# Patient Record
Sex: Female | Born: 1940 | Race: White | Hispanic: No | Marital: Married | Smoking: Former smoker
Health system: Southern US, Community
[De-identification: ages and names within clinical notes are randomized; demographics above are authoritative.]

## PROBLEM LIST (undated history)

## (undated) DIAGNOSIS — K900 Celiac disease: Secondary | ICD-10-CM

## (undated) DIAGNOSIS — M1990 Unspecified osteoarthritis, unspecified site: Secondary | ICD-10-CM

## (undated) DIAGNOSIS — K5790 Diverticulosis of intestine, part unspecified, without perforation or abscess without bleeding: Secondary | ICD-10-CM

## (undated) DIAGNOSIS — J45909 Unspecified asthma, uncomplicated: Secondary | ICD-10-CM

## (undated) DIAGNOSIS — I499 Cardiac arrhythmia, unspecified: Secondary | ICD-10-CM

## (undated) DIAGNOSIS — M5430 Sciatica, unspecified side: Secondary | ICD-10-CM

## (undated) DIAGNOSIS — C801 Malignant (primary) neoplasm, unspecified: Secondary | ICD-10-CM

## (undated) DIAGNOSIS — F0390 Unspecified dementia without behavioral disturbance: Secondary | ICD-10-CM

## (undated) DIAGNOSIS — Z8489 Family history of other specified conditions: Secondary | ICD-10-CM

## (undated) DIAGNOSIS — J189 Pneumonia, unspecified organism: Secondary | ICD-10-CM

## (undated) HISTORY — PX: TONSILLECTOMY: SUR1361

## (undated) HISTORY — DX: Diverticulosis of intestine, part unspecified, without perforation or abscess without bleeding: K57.90

---

## 2006-12-28 ENCOUNTER — Other Ambulatory Visit: Payer: Self-pay

## 2006-12-28 ENCOUNTER — Emergency Department: Payer: Self-pay | Admitting: Emergency Medicine

## 2015-09-28 ENCOUNTER — Other Ambulatory Visit: Payer: Self-pay | Admitting: Internal Medicine

## 2015-09-28 ENCOUNTER — Ambulatory Visit
Admission: RE | Admit: 2015-09-28 | Discharge: 2015-09-28 | Disposition: A | Discharge status: Home / Self Care | Payer: Medicare Other | Source: Ambulatory Visit | Attending: Internal Medicine | Admitting: Internal Medicine

## 2015-09-28 DIAGNOSIS — R062 Wheezing: Secondary | ICD-10-CM | POA: Diagnosis present

## 2015-09-28 DIAGNOSIS — R0789 Other chest pain: Secondary | ICD-10-CM | POA: Diagnosis not present

## 2015-09-28 DIAGNOSIS — R059 Cough, unspecified: Secondary | ICD-10-CM

## 2015-09-28 DIAGNOSIS — R05 Cough: Secondary | ICD-10-CM | POA: Diagnosis present

## 2015-09-28 DIAGNOSIS — R918 Other nonspecific abnormal finding of lung field: Secondary | ICD-10-CM | POA: Diagnosis not present

## 2016-01-04 ENCOUNTER — Ambulatory Visit: Payer: Medicare Other | Admitting: Family Medicine

## 2016-01-04 ENCOUNTER — Ambulatory Visit (INDEPENDENT_AMBULATORY_CARE_PROVIDER_SITE_OTHER): Payer: Medicare Other | Admitting: Family Medicine

## 2016-01-04 ENCOUNTER — Encounter: Payer: Self-pay | Admitting: Family Medicine

## 2016-01-04 VITALS — BP 134/84 | HR 85 | Ht 66.0 in | Wt 200.0 lb

## 2016-01-04 DIAGNOSIS — J4550 Severe persistent asthma, uncomplicated: Secondary | ICD-10-CM | POA: Diagnosis not present

## 2016-01-04 DIAGNOSIS — J4540 Moderate persistent asthma, uncomplicated: Secondary | ICD-10-CM | POA: Diagnosis not present

## 2016-01-04 DIAGNOSIS — G5701 Lesion of sciatic nerve, right lower limb: Secondary | ICD-10-CM | POA: Diagnosis not present

## 2016-01-04 NOTE — Progress Notes (Signed)
Pre visit review using our clinic review tool, if applicable. No additional management support is needed unless otherwise documented below in the visit note. 

## 2016-01-04 NOTE — Assessment & Plan Note (Signed)
Patient does have some piriformis on the right side. Has tried anti-inflammatories with minimal benefit. I do not believe there is any lumbar radiculopathy but it is within the differential. If continuing pain at follow-up we'll consider x-rays. Patient work with Product/process development scientist to learn home exercises. Encourage patient to start working on a more regular basis. Discussed manual massage.  Piriformis Syndrome  Using an anatomical model, reviewed with the patient the structures involved and how they related to diagnosis. The patient indicated understanding.   The patient was given a handout from Dr. Arne Cleveland book "The Sports Medicine Patient Advisor" describing the anatomy and rehabilitation of the following condition: Piriformis Syndrome  Also given a handout with more extensive Piriformis stretching, hip flexor and abductor strengthening, ham stretching  Rec deep massage, explained self-massage with ball  Patient will return in 3-4 weeks for further evaluation.

## 2016-01-04 NOTE — Progress Notes (Signed)
[facility] [address], [place] [address] Phone: [phone] Subjective:    I'm seeing this patient by the request  of:  TATE,DENNY C, MD  CC: back pain with sciatica on right   [id number] Barbara Savage is a 75 y.o. female coming in with complaint of neck pain with right-sided radiation. Patient states this seems to come on the lateral aspect the leg and stops to her knee. Patient states this seems to be worse with sitting. Patient has tried to be active but not significantly. Patient does do a lot of sitting for work and seems to have had more irritation recently. Patient states when she gets moving he seems to get a little bit better. Patient rates the severity of pain a 6 out of 10. No nighttime awakening. No significant back pain with it. Denies any fever, chills, any abnormal weight loss. No weakness down the leg.     No past medical history on file. chronic persistent asthma No past surgical history on file. patient has had meniscectomies in the knees bilaterally Social History   Social History  . Marital Status: Married    Spouse Name: N/A  . Number of Children: N/A  . Years of Education: N/A   Social History Main Topics  . Smoking status: Not on file  . Smokeless tobacco: Not on file  . Alcohol Use: Not on file  . Drug Use: Not on file  . Sexual Activity: Not on file   Other Topics Concern  . Not on file   Social History Narrative  . No narrative on file   Not on File No family history on file. no pertinent family medical history as related to chief complaint  Past medical history, social, surgical and family history all reviewed in electronic medical record.  No pertanent information unless stated regarding to the chief complaint.   Review of Systems: No headache, visual changes, nausea, vomiting, diarrhea, constipation, dizziness, abdominal pain, skin rash, fevers, chills, night sweats, weight loss, swollen lymph  nodes, body aches, joint swelling, muscle aches, chest pain, shortness of breath, mood changes.   Objective Blood pressure 134/84, pulse 85, height 5\' 6"  (1.676 m), weight 200 lb (90.719 kg), SpO2 97 %.  General: No apparent distress alert and oriented x3 mood and affect normal, dressed appropriately.  HEENT: Pupils equal, extraocular movements intact  Respiratory: Patient's speak in full sentences and does not appear short of breath mild audible wheeze Cardiovascular: No lower extremity edema, non tender, no erythema  Skin: Warm dry intact with no signs of infection or rash on extremities or on axial skeleton.  Abdomen: Soft nontender  Neuro: Cranial nerves II through XII are intact, neurovascularly intact in all extremities with 2+ DTRs and 2+ pulses.  Lymph: No lymphadenopathy of posterior or anterior cervical chain or axillae bilaterally.  Gait normal with good balance and coordination.  MSK:  Non tender with full range of motion and good stability and symmetric strength and tone of shoulders, elbows, wrist, hip, knee and ankles bilaterally.  Back Exam:  Inspection: Unremarkable  Motion: Flexion 45 deg, Extension 25 deg, Side Bending to 25 deg bilaterally,  Rotation to 35 deg bilaterally  SLR laying: Negative  XSLR laying: Negative  Palpable tenderness: Severely tender to palpation over the right piriformis FABER: Positive right side Sensory change: Gross sensation intact to all lumbar and sacral dermatomes.  Reflexes: 2+ at both patellar tendons, 2+ at achilles tendons, Babinski's downgoing.  Strength  at foot  Plantar-flexion: 5/5 Dorsi-flexion: 5/5 Eversion: 5/5 Inversion: 5/5  Leg strength  Quad: 5/5 Hamstring: 5/5 Hip flexor: 5/5 Hip abductors: 4/5 but symmetric Gait unremarkable.  Procedure note [id number]; 15 minutes spent for Therapeutic exercises as stated in above notes.  This included exercises focusing on stretching, strengthening, with significant focus on eccentric aspects.  Piriformis Syndrome  Using an anatomical model, reviewed with the patient the structures involved and how they related to diagnosis. The patient indicated understanding.   The patient was given a handout from Dr. Arne Cleveland book "The Sports Medicine Patient Advisor" describing the anatomy and rehabilitation of the following condition: Piriformis Syndrome  Also given a handout with more extensive Piriformis stretching, hip flexor and abductor strengthening, ham stretching  Rec deep massage, explained self-massage with ball    Proper technique shown and discussed handout in great detail with ATC.  All questions were discussed and answered.      Impression and Recommendations:     This case required medical decision making of moderate complexity.      Note: This dictation was prepared with Dragon dictation along with smaller phrase technology. Any transcriptional errors that result from this process are unintentional.

## 2016-01-04 NOTE — Patient Instructions (Signed)
Good to see you.  Ice 20 minutes 2 times daily. Usually after activity and before bed. Exercises 3 times a week.  Tennis ball in back right pocket with sitting Double up your vitamin D to 1600 IU daily I love the idea of the pool.  Good shoes with rigid bottom.  Jalene Mullet, [place] or New balance greater then 700 Stay active See me again in 3-4 weeks to make sure you are all the way better Happy New Year!

## 2016-01-10 ENCOUNTER — Institutional Professional Consult (permissible substitution): Payer: Medicare Other | Admitting: Internal Medicine

## 2016-01-13 ENCOUNTER — Encounter: Payer: Self-pay | Admitting: Internal Medicine

## 2016-01-13 ENCOUNTER — Ambulatory Visit (INDEPENDENT_AMBULATORY_CARE_PROVIDER_SITE_OTHER): Payer: Medicare Other | Admitting: Internal Medicine

## 2016-01-13 VITALS — BP 164/102 | HR 90 | Ht 66.0 in | Wt 199.0 lb

## 2016-01-13 DIAGNOSIS — R0600 Dyspnea, unspecified: Secondary | ICD-10-CM | POA: Insufficient documentation

## 2016-01-13 DIAGNOSIS — I10 Essential (primary) hypertension: Secondary | ICD-10-CM

## 2016-01-13 NOTE — Progress Notes (Signed)
Quick Note:  Pt aware ______ 

## 2016-01-13 NOTE — Patient Instructions (Signed)
Stop Breo and call me with the name of the other inhaler (don't start it until/unless breathing or coughing get worse but we definitely need to know what it is)  If hoarseness not better off BREO:  Try prilosec otc 20mg   Take 30-60 min before first meal of the day and Pepcid ac (famotidine) 20 mg one @  bedtime x 4 weeks trial and if not better you need to see ENT    GERD (REFLUX)  is an extremely common cause of respiratory symptoms just like yours , many times with no obvious heartburn at all.    It can be treated with medication, but also with lifestyle changes including elevation of the head of your bed (ideally with 6 inch  bed blocks),  Smoking cessation, avoidance of late meals, excessive alcohol, and avoid fatty foods, chocolate, peppermint, colas, red wine, and acidic juices such as orange juice.  NO MINT OR MENTHOL PRODUCTS SO NO COUGH DROPS  USE SUGARLESS CANDY INSTEAD (Jolley ranchers or Stover's or Life Savers) or even ice chips will also do - the key is to swallow to prevent all throat clearing. NO OIL BASED VITAMINS - use powdered substitutes.

## 2016-01-13 NOTE — Progress Notes (Signed)
Subjective:    Patient ID: Barbara Savage, female    DOB: 10/20/1941, 75 y.o.   MRN: [id number]  HPI  51 yowm quit smoking age 59 with h/o breathing problems as 77 y old saw allergist > pos allergies/ rec shot, better p moved new house so no shots then mono as teenager some breathing difficulty with this but better p tonsilectomy then started getting "bronchitis" age 78 [place] > eval allergies to cat/dog > rx inhalers > got rid of the cat and later inhalers and also rec NF > never had it as rec  and weaned off all meds except for rare albuterol then Oct 2016 lots of cough / nasal congestion and epistaxis  > eval by Hall Busing >  Some better on first inhaler but doesn't know the name, wasn't for asthma ? [facility]ama changed then to  [facility] > no better vs the first inhaler > then worse hoarseness so referred 01/13/2016 to pulmonary clinic by Dr Charlann Boxer to sort out what she needs    01/13/2016 1st [facility] office visit/ Barbara Savage   Chief Complaint  Patient presents with  . Pulmonary Consult    Referred by Dr. Gardenia Phlegm. Pt states that she was dxed with Asthma approx 10 yrs ago. She states c/o SOB, cough and hoarseness since Oct 2016. She also c/o right side CP upon inspiration.  She states that her cough is worse when exposed to smoke and when she talks too much. Cough is prod with minimal light yellow sputum.    cp comes and goes since  aug 2016 RUQ not in the chest and  not really pleuritic, better lying down  Hoarseness is worse  The more she speaks/ cough is minimal now/ assoc with sore throat  No obvious day to day or daytime variability or assoc   chest tightness, subjective wheeze or overt sinus or hb symptoms. No unusual exp hx or h/o childhood pna/ asthma or knowledge of premature birth.  Sleeping ok without nocturnal  or early am exacerbation  of respiratory  c/o's or need for noct saba. Also denies any obvious fluctuation of symptoms with weather or environmental changes or other  aggravating or alleviating factors except as outlined above   Current Medications, Allergies, Complete Past Medical History, Past Surgical History, Family History, and Social History were reviewed in [employer] record.              Review of Systems  Constitutional: Negative for fever, chills and unexpected weight change.  HENT: Positive for sore throat and trouble swallowing. Negative for congestion, dental problem, ear pain, nosebleeds, postnasal drip, rhinorrhea, sinus pressure, sneezing and voice change.   Eyes: Negative for visual disturbance.  Respiratory: Positive for cough and shortness of breath. Negative for choking.   Cardiovascular: Positive for chest pain. Negative for leg swelling.  Gastrointestinal: Negative for vomiting, abdominal pain and diarrhea.  Genitourinary: Negative for difficulty urinating.  Musculoskeletal: Negative for arthralgias.  Skin: Negative for rash.  Neurological: Positive for headaches. Negative for tremors and syncope.  Hematological: Does not bruise/bleed easily.       Objective:   Physical Exam  amb wf  nad - classic voice fatigue   Wt Readings from Last 3 Encounters:  01/13/16 199 lb (90.266 kg)  01/04/16 200 lb (90.719 kg)    Vital signs reviewed    HEENT: nl dentition, turbinates, and oropharynx. Nl external ear canals without cough reflex   NECK :  without  JVD/Nodes/TM/ nl carotid upstrokes bilaterally   LUNGS: no acc muscle use,  Nl contour chest which is clear to A and P bilaterally without cough on insp or exp maneuvers   CV:  RRR  no s3 or murmur or increase in P2, no edema   ABD:  soft and nontender with nl inspiratory excursion in the supine position. No bruits or organomegaly, bowel sounds nl  MS:  Nl gait/ ext warm without deformities, calf tenderness, cyanosis or clubbing No obvious joint restrictions   SKIN: warm and dry without lesions    NEURO:  alert, approp, nl sensorium with  no  motor deficits     I personally reviewed images and agree with radiology impression as follows:  CXR:  09/28/15 1. Mild lingular subsegmental atelectasis and or infiltrate. Pleural parenchymal scarring could also present in this fashion.  2. Borderline cardiomegaly. No pulmonary venous congestion      Assessment & Plan:

## 2016-01-14 ENCOUNTER — Telehealth: Payer: Self-pay | Admitting: *Deleted

## 2016-01-14 ENCOUNTER — Encounter: Payer: Self-pay | Admitting: Internal Medicine

## 2016-01-14 DIAGNOSIS — I10 Essential (primary) hypertension: Secondary | ICD-10-CM | POA: Insufficient documentation

## 2016-01-14 NOTE — Telephone Encounter (Signed)
Spoke with the pt  She will followup with PCP on BP  She is aware Arlyce Harman is normal  The name of the other inhaler is Anoro  She will let us know if her symptoms do not improve off of Breo

## 2016-01-14 NOTE — Assessment & Plan Note (Signed)
-   Spirometry 01/13/2016  FEV1 2.26 (98%)  Ratio 71 on Breo  Symptoms are markedly disproportionate to objective findings and not clear this is a lung problem but pt does appear to have difficult airway management issues. DDX of  difficult airways management almost all start with A and  include Adherence, Ace Inhibitors, Acid Reflux, Active Sinus Disease, Alpha 1 Antitripsin deficiency, Anxiety masquerading as Airways dz,  ABPA,  Allergy(esp in young), Aspiration (esp in elderly), Adverse effects of meds,  Active smokers, A bunch of PE's (a small clot burden can't cause this syndrome unless there is already severe underlying pulm or vascular dz with poor reserve) plus two Bs  = Bronchiectasis and Beta blocker use..and one C= CHF   Adherence is always the initial "prime suspect" and is a multilayered concern that requires a "trust but verify" approach in every patient - starting with knowing how to use medications, especially inhalers, correctly, keeping up with refills and understanding the fundamental difference between maintenance and prns vs those medications only taken for a very short course and then stopped and not refilled.   ? Allergy/ asthma > if she has it at all it seems more intermittent than chronic and this type of pt best served by either symbicort or dulera low doses 2 q 12 h prn.  Since she has "an inhaler that worked before the breo was started have asked her to let me know what that is before I rec another.  ? Adverse effect of dpi > classic voice fatigue >> try off breo   ? Acid (or non-acid) GERD > always difficult to exclude as up to 75% of pts in some series report no assoc GI/ Heartburn symptoms> rec max (24h)  acid suppression and diet restrictions/ reviewed and instructions given in writing.

## 2016-01-14 NOTE — Telephone Encounter (Signed)
-----   Message from Tanda Rockers, MD sent at 01/14/2016  6:37 AM EST ----- Call to find out what the name was of the other inhaler/ i didn't see the bp was up> f/u pc Let her know spiriometry wnl

## 2016-01-14 NOTE — Assessment & Plan Note (Signed)
Noted/ rec avoid na/ Follow up per Primary Care

## 2016-01-25 ENCOUNTER — Encounter: Payer: Self-pay | Admitting: Family Medicine

## 2016-01-25 ENCOUNTER — Ambulatory Visit (INDEPENDENT_AMBULATORY_CARE_PROVIDER_SITE_OTHER): Payer: Medicare Other | Admitting: Family Medicine

## 2016-01-25 VITALS — BP 132/80 | Wt 203.0 lb

## 2016-01-25 DIAGNOSIS — G5701 Lesion of sciatic nerve, right lower limb: Secondary | ICD-10-CM

## 2016-01-25 MED ORDER — MELOXICAM 7.5 MG PO TABS: 7.5000 mg | ORAL_TABLET | Freq: Every day | ORAL | Status: DC

## 2016-01-25 NOTE — Patient Instructions (Signed)
Good to see you  Meloxicam 7.5 mg daily for 2 weeks then 3 times a week for 2 weeks then discontinue I love the swimming but should incorporate 1-2 days a week of full body resistance training on the machines Ice 20 minutes 2 times daily. Usually after activity and before bed. Continue with the ball. It will do great  I am impressed and want to see you again in 6 weeks.

## 2016-01-25 NOTE — Assessment & Plan Note (Signed)
Patient is making strides. We'll decrease patient's meloxicam dose to see if this would be beneficial. She will monitor her asthma. We discussed icing regimen. Discussed continuing the manual massage. Declined formal physical therapy. Follow-up in 6 weeks.

## 2016-01-25 NOTE — Progress Notes (Signed)
[facility] [address], [place] [address] Phone: [phone] Subjective:    CC: back pain with sciatica on right follow-up   [id number] Barbara Savage is a 75 y.o. female coming in with complaint of back pain on the right side. Patient was found to have more of a piriformis syndrome. Patient has been doing manual massage, home exercises, as well as taking meloxicam. States that the anti-inflammatory was causing some mild exacerbation of her asthma. Stopped this but feels that it was helping. Patient states overall she is about 50-60% better. Has started swimming again which has helped as well. Denies that the radicular symptoms are worsening. States that she seems to be more comfortable at night.     No past medical history on file. chronic persistent asthma No past surgical history on file. patient has had meniscectomies in the knees bilaterally Social History   Social History  . Marital Status: Married    Spouse Name: N/A  . Number of Children: N/A  . 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 Smoking status: Former Smoker -- 2.50 packs/day for 10 years    Types: Cigarettes    Quit date: 12/25/1978  . Smokeless tobacco: Never Used  . Alcohol Use: No  . Drug Use: No  . Sexual Activity: Not Asked   Other Topics Concern  . None   Social History Narrative   Allergies  Allergen Reactions  . Penicillins Rash   No family history on file. no pertinent family medical history as related to chief complaint  Past medical history, social, surgical and family history all reviewed in electronic medical record.  No pertanent information unless stated regarding to the chief complaint.   Review of Systems: No headache, visual changes, nausea, vomiting, diarrhea, constipation, dizziness, abdominal pain, skin rash, fevers, chills, night sweats, weight loss, swollen lymph nodes, body aches, joint  swelling, muscle aches, chest pain, shortness of breath, mood changes.   Objective Blood pressure 132/80, weight 203 lb (92.08 kg).  General: No apparent distress alert and oriented x3 mood and affect normal, dressed appropriately.  HEENT: Pupils equal, extraocular movements intact  Respiratory: Patient's speak in full sentences and does not appear short of breath mild audible wheeze Cardiovascular: No lower extremity edema, non tender, no erythema  Skin: Warm dry intact with no signs of infection or rash on extremities or on axial skeleton.  Abdomen: Soft nontender  Neuro: Cranial nerves II through XII are intact, neurovascularly intact in all extremities with 2+ DTRs and 2+ pulses.  Lymph: No lymphadenopathy of posterior or anterior cervical chain or axillae bilaterally.  Gait normal with good balance and coordination.  MSK:  Non tender with full range of motion and good stability and symmetric strength and tone of shoulders, elbows, wrist, hip, knee and ankles bilaterally.  Back Exam:  Inspection: Unremarkable  Motion: Flexion 45 deg, Extension 25 deg, Side Bending to 25 deg bilaterally,  Rotation to 35 deg bilaterally  SLR laying: Negative  XSLR laying: Negative  Palpable tenderness: Mild tenderness still over the right piriformis FABER: Positive right side Sensory change: Gross sensation intact to all lumbar and sacral dermatomes.  Reflexes: 2+ at both patellar tendons, 2+ at achilles tendons, Babinski's downgoing.  Strength at foot  Plantar-flexion: 5/5 Dorsi-flexion: 5/5 Eversion: 5/5 Inversion: 5/5  Leg strength  Quad: 5/5 Hamstring: 5/5 Hip flexor: 5/5 Hip abductors: 4/5 but symmetric  Gait unremarkable.   Impression and Recommendations:     This case required medical decision making of moderate complexity.      Note: This dictation was prepared with Dragon dictation along with smaller phrase technology. Any transcriptional errors that result from this process are  unintentional.

## 2016-02-15 ENCOUNTER — Ambulatory Visit: Payer: Medicare Other | Admitting: Family Medicine

## 2016-03-07 ENCOUNTER — Ambulatory Visit: Payer: Medicare Other | Admitting: Family Medicine

## 2016-03-14 ENCOUNTER — Ambulatory Visit (INDEPENDENT_AMBULATORY_CARE_PROVIDER_SITE_OTHER): Payer: Medicare Other | Admitting: Family Medicine

## 2016-03-14 VITALS — BP 138/82 | HR 76 | Wt 200.0 lb

## 2016-03-14 DIAGNOSIS — G5701 Lesion of sciatic nerve, right lower limb: Secondary | ICD-10-CM

## 2016-03-14 NOTE — Patient Instructions (Signed)
Good to see you  Lets try  Turmeric 500mg  twice daily  Tart cherry extract any dose at night I love the swimming and then aerobics with light weights See me when you need me.

## 2016-03-14 NOTE — Assessment & Plan Note (Signed)
Overall doing well. If any exacerbation for patient come back. Patient is taking ibuprofen occasionally. We discussed over-the-counter medications. Otherwise patient will follow-up as needed.

## 2016-03-14 NOTE — Progress Notes (Signed)
[facility] [address] [place], [place] [address] Phone: [phone] Subjective:    CC: back pain with sciatica on right follow-up   [id number] Barbara Savage is a 75 y.o. female coming in with complaint of back pain on the right side. Patient was found to have more of a piriformis syndrome. Patient has been doing manual massage, home exercises, as well as taking meloxicam what caused exacerbation of her asthma. Patient was continuing to increase her activity and was doing 60-70% better previously. Encourage her to continue with the conservative therapy. Patient statesshe is doing about 85% better. Not doing any other conservative therapy at this time because she this is a stressful time for her job. States that overall she is feeling much better.    No past medical history on file. chronic persistent asthma No past surgical history on file. patient has had meniscectomies in the knees bilaterally Social History   Social History  . Marital Status: Married    Spouse Name: N/A  . Number of Children: N/A  . 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 Smoking status: Former Smoker -- 2.50 packs/day for 10 years    Types: Cigarettes    Quit date: 12/25/1978  . Smokeless tobacco: Never Used  . Alcohol Use: No  . Drug Use: No  . Sexual Activity: Not on file   Other Topics Concern  . Not on file   Social History Narrative   Allergies  Allergen Reactions  . Penicillins Rash   No family history on file. no pertinent family medical history as related to chief complaint  Past medical history, social, surgical and family history all reviewed in electronic medical record.  No pertanent information unless stated regarding to the chief complaint.   Review of Systems: No headache, visual changes, nausea, vomiting, diarrhea, constipation, dizziness, abdominal pain, skin rash, fevers, chills, night sweats,  weight loss, swollen lymph nodes, body aches, joint swelling, muscle aches, chest pain, shortness of breath, mood changes.   Objective Blood pressure 138/82, pulse 76, weight 200 lb (90.719 kg).  General: No apparent distress alert and oriented x3 mood and affect normal, dressed appropriately.  HEENT: Pupils equal, extraocular movements intact  Respiratory: Patient's speak in full sentences and does not appear short of breath mild audible wheeze Cardiovascular: No lower extremity edema, non tender, no erythema  Skin: Warm dry intact with no signs of infection or rash on extremities or on axial skeleton.  Abdomen: Soft nontender  Neuro: Cranial nerves II through XII are intact, neurovascularly intact in all extremities with 2+ DTRs and 2+ pulses.  Lymph: No lymphadenopathy of posterior or anterior cervical chain or axillae bilaterally.  Gait normal with good balance and coordination.  MSK:  Non tender with full range of motion and good stability and symmetric strength and tone of shoulders, elbows, wrist, hip, knee and ankles bilaterally.  Back Exam:  Inspection: Unremarkable  Motion: Flexion 45 deg, Extension 25 deg, Side Bending to 25 deg bilaterally,  Rotation to 35 deg bilaterally  SLR laying: Negative  XSLR laying: Negative  Palpable tenderness: very minimal tenderness of the right piriformis FABER: Positive right side Sensory change: Gross sensation intact to all lumbar and sacral dermatomes.  Reflexes: 2+ at both patellar tendons, 2+ at achilles tendons, Babinski's downgoing.  Strength at foot  Plantar-flexion: 5/5 Dorsi-flexion: 5/5 Eversion: 5/5 Inversion: 5/5  Leg strength  Quad:  5/5 Hamstring: 5/5 Hip flexor: 5/5 Hip abductors: 4/5 but symmetric Gait unremarkable. Mild improvement   Impression and Recommendations:     This case required medical decision making of moderate complexity.      Note: This dictation was prepared with Dragon dictation along with smaller  phrase technology. Any transcriptional errors that result from this process are unintentional.

## 2016-04-17 ENCOUNTER — Other Ambulatory Visit: Payer: Self-pay | Admitting: Family Medicine

## 2016-06-08 ENCOUNTER — Encounter (HOSPITAL_COMMUNITY): Payer: Self-pay

## 2016-06-08 ENCOUNTER — Emergency Department (HOSPITAL_COMMUNITY): Payer: Medicare Other

## 2016-06-08 ENCOUNTER — Observation Stay (HOSPITAL_COMMUNITY)
Admission: EM | Admit: 2016-06-08 | Discharge: 2016-06-10 | Disposition: A | Discharge status: Home / Self Care | Payer: Medicare Other | Attending: Family Medicine | Admitting: Family Medicine

## 2016-06-08 DIAGNOSIS — Z79899 Other long term (current) drug therapy: Secondary | ICD-10-CM | POA: Diagnosis not present

## 2016-06-08 DIAGNOSIS — J45909 Unspecified asthma, uncomplicated: Secondary | ICD-10-CM | POA: Insufficient documentation

## 2016-06-08 DIAGNOSIS — Z87891 Personal history of nicotine dependence: Secondary | ICD-10-CM | POA: Diagnosis not present

## 2016-06-08 DIAGNOSIS — J4540 Moderate persistent asthma, uncomplicated: Secondary | ICD-10-CM | POA: Diagnosis present

## 2016-06-08 DIAGNOSIS — K5792 Diverticulitis of intestine, part unspecified, without perforation or abscess without bleeding: Secondary | ICD-10-CM | POA: Diagnosis not present

## 2016-06-08 DIAGNOSIS — I10 Essential (primary) hypertension: Secondary | ICD-10-CM | POA: Diagnosis present

## 2016-06-08 DIAGNOSIS — R1084 Generalized abdominal pain: Secondary | ICD-10-CM | POA: Diagnosis present

## 2016-06-08 HISTORY — DX: Sciatica, unspecified side: M54.30

## 2016-06-08 HISTORY — DX: Celiac disease: K90.0

## 2016-06-08 HISTORY — DX: Unspecified asthma, uncomplicated: J45.909

## 2016-06-08 LAB — COMPREHENSIVE METABOLIC PANEL
ALT: 20 U/L (ref 14–54)
ANION GAP: 8 (ref 5–15)
AST: 19 U/L (ref 15–41)
Albumin: 4.1 g/dL (ref 3.5–5.0)
Alkaline Phosphatase: 78 U/L (ref 38–126)
BUN: 10 mg/dL (ref 6–20)
CHLORIDE: 102 mmol/L (ref 101–111)
CO2: 23 mmol/L (ref 22–32)
CREATININE: 0.65 mg/dL (ref 0.44–1.00)
Calcium: 8.9 mg/dL (ref 8.9–10.3)
GFR calc non Af Amer: >60 mL/min (ref >60)
Glucose, Bld: 109 mg/dL — ABNORMAL HIGH (ref 65–99)
Potassium: 3.5 mmol/L (ref 3.5–5.1)
SODIUM: 133 mmol/L — AB (ref 135–145)
Total Bilirubin: 1.1 mg/dL (ref 0.3–1.2)
Total Protein: 7.6 g/dL (ref 6.5–8.1)

## 2016-06-08 LAB — CBC
HEMATOCRIT: 40.4 % (ref 36.0–46.0)
HEMOGLOBIN: 13.4 g/dL (ref 12.0–15.0)
MCH: 30.5 pg (ref 26.0–34.0)
MCHC: 33.2 g/dL (ref 30.0–36.0)
MCV: 91.8 fL (ref 78.0–100.0)
Platelets: 287 10*3/uL (ref 150–400)
RBC: 4.4 MIL/uL (ref 3.87–5.11)
RDW: 13.7 % (ref 11.5–15.5)
WBC: 17.7 10*3/uL — ABNORMAL HIGH (ref 4.0–10.5)

## 2016-06-08 LAB — URINALYSIS, ROUTINE W REFLEX MICROSCOPIC
Bilirubin Urine: NEGATIVE
GLUCOSE, UA: NEGATIVE mg/dL
Ketones, ur: NEGATIVE mg/dL
Nitrite: NEGATIVE
Protein, ur: NEGATIVE mg/dL
SPECIFIC GRAVITY, URINE: 1.025 (ref 1.005–1.030)
pH: 5.5 (ref 5.0–8.0)

## 2016-06-08 LAB — URINE MICROSCOPIC-ADD ON

## 2016-06-08 LAB — LIPASE, BLOOD: LIPASE: 20 U/L (ref 11–51)

## 2016-06-08 LAB — LACTIC ACID, PLASMA: LACTIC ACID, VENOUS: 0.8 mmol/L (ref 0.5–2.0)

## 2016-06-08 MED ORDER — IOPAMIDOL (ISOVUE-300) INJECTION 61%
100.0000 mL | Freq: Once | INTRAVENOUS | Status: AC | PRN
  Administered 2016-06-08: 100 mL via INTRAVENOUS

## 2016-06-08 MED ORDER — SODIUM CHLORIDE 0.9 % IV SOLN
INTRAVENOUS | Status: DC
  Administered 2016-06-08: 23:00:00 via INTRAVENOUS

## 2016-06-08 NOTE — ED Provider Notes (Signed)
CSN: [id number]     Arrival date & time 06/08/16  1708 History   First MD Initiated Contact with Patient 06/08/16 2114     No chief complaint on file.     HPI  Pt was seen at 2120.  Per pt, c/o gradual onset and persistence of constant generalized abd "pain" for the past 3 days.  Has been associated with multiple intermittent episodes of "loose stools" and urinary urgency/frequency. Pt was evaluated by her PMD today, was told "I have rebound," and was sent to the ED for further evaluation.  Denies N/V, no diarrhea, no fevers, no back pain, no rash, no CP/SOB, no black or blood in stools, no dysuria/hematuria.     Past Medical History  Diagnosis Date  . Sciatica   . Asthma   . Celiac disease    Past Surgical History  Procedure Laterality Date  . Tonsillectomy      Social History  Substance Use Topics  . Smoking status: Former Smoker -- 2.50 packs/day for 10 years    Types: Cigarettes    Quit date: 12/25/1978  . Smokeless tobacco: Never Used  . Alcohol Use: No    Review of Systems ROS: Statement: All systems negative except as marked or noted in the HPI; Constitutional: Negative for fever and chills. ; ; Eyes: Negative for eye pain, redness and discharge. ; ; ENMT: Negative for ear pain, hoarseness, nasal congestion, sinus pressure and sore throat. ; ; Cardiovascular: Negative for chest pain, palpitations, diaphoresis, dyspnea and peripheral edema. ; ; Respiratory: Negative for cough, wheezing and stridor. ; ; Gastrointestinal: +abd pain, loose stool. Negative for nausea, vomiting, diarrhea, blood in stool, hematemesis, jaundice and rectal bleeding. . ; ; Genitourinary: Negative for dysuria, flank pain and hematuria. ; ; Musculoskeletal: Negative for back pain and neck pain. Negative for swelling and trauma.; ; Skin: Negative for pruritus, rash, abrasions, blisters, bruising and skin lesion.; ; Neuro: Negative for headache, lightheadedness and neck stiffness. Negative for weakness,  altered level of consciousness, altered mental status, extremity weakness, paresthesias, involuntary movement, seizure and syncope.      Allergies  Breo ellipta; Mobic; and Penicillins  Home Medications   Prior to Admission medications   Medication Sig Start Date End Date Taking? Authorizing Provider  B Complex Vitamins (B COMPLEX 50 PO) Take 1 tablet by mouth daily. Reported on 03/14/2016   Yes Historical Provider, MD  Ergocalciferol (VITAMIN D2) 2000 units TABS Take 1 tablet by mouth daily.   Yes Historical Provider, MD  Lutein 20 MG CAPS Take 1 capsule by mouth daily. Reported on 03/14/2016   Yes Historical Provider, MD  Magnesium 200 MG TABS Take 1 tablet by mouth daily. Reported on 03/14/2016   Yes Historical Provider, MD  Multiple Vitamin (MULTIVITAMIN WITH MINERALS) TABS tablet Take 1 tablet by mouth daily.   Yes Historical Provider, MD  PROAIR HFA [id number] Base) MCG/ACT inhaler Inhale 2 puffs into the lungs every 6 (six) hours as needed for wheezing or shortness of breath.  12/07/15  Yes Historical Provider, MD  Pseudoephedrine-Acetaminophen (SINUS RELIEF PO) Take 1 tablet by mouth daily as needed (for sinus relief).   Yes Historical Provider, MD  Quercetin 250 MG TABS Take 1 tablet by mouth daily. For allergy health   Yes Historical Provider, MD   BP 146/80 mmHg  Pulse 104  Temp(Src) 99.2 F (37.3 C) (Oral)  Resp 20  Ht 5\' 6"  (1.676 m)  Wt 200 lb (90.719 kg)  BMI 32.30 kg/m2  SpO2 95%   BP 134/64 mmHg  Pulse 83  Temp(Src) 98 F (36.7 C) (Oral)  Resp 20  Ht 5\' 6"  (1.676 m)  Wt 200 lb 2.8 oz (90.8 kg)  BMI 32.32 kg/m2  SpO2 97%  Physical Exam  2125: Physical examination:  Nursing notes reviewed; Vital signs and O2 SAT reviewed;  Constitutional: Well developed, Well nourished, Well hydrated, In no acute distress; Head:  Normocephalic, atraumatic; Eyes: EOMI, PERRL, No scleral icterus; ENMT: Mouth and pharynx normal, Mucous membranes moist; Neck: Supple, Full range of  motion, No lymphadenopathy; Cardiovascular: Regular rate and rhythm, No gallop; Respiratory: Breath sounds clear & equal bilaterally, No wheezes.  Speaking full sentences with ease, Normal respiratory effort/excursion; Chest: Nontender, Movement normal; Abdomen: Soft, +diffuse tenderness to palp. No rebound or guarding. Nondistended, Normal bowel sounds; Genitourinary: No CVA tenderness; Extremities: Pulses normal, No tenderness, No edema, No calf edema or asymmetry.; Neuro: AA&Ox3, Major CN grossly intact.  Speech clear. No gross focal motor or sensory deficits in extremities. Climbs on and off stretcher easily by herself. Gait steady.; Skin: Color normal, Warm, Dry.   ED Course  Procedures (including critical care time) Labs Review  Imaging Review  I have personally reviewed and evaluated these images and lab results as part of my medical decision-making.   EKG Interpretation None      MDM  MDM Reviewed: previous chart, nursing note and vitals Reviewed previous: labs Interpretation: labs, x-ray and CT scan     Results for orders placed or performed during the hospital encounter of 06/08/16  Lipase, blood  Result Value Ref Range   Lipase 20 11 - 51 U/L  Comprehensive metabolic panel  Result Value Ref Range   Sodium 133 (L) 135 - 145 mmol/L   Potassium 3.5 3.5 - 5.1 mmol/L   Chloride 102 101 - 111 mmol/L   CO2 23 22 - 32 mmol/L   Glucose, Bld 109 (H) 65 - 99 mg/dL   BUN 10 6 - 20 mg/dL   Creatinine, Ser 0.65 0.44 - 1.00 mg/dL   Calcium 8.9 8.9 - 10.3 mg/dL   Total Protein 7.6 6.5 - 8.1 g/dL   Albumin 4.1 3.5 - 5.0 g/dL   AST 19 15 - 41 U/L   ALT 20 14 - 54 U/L   Alkaline Phosphatase 78 38 - 126 U/L   Total Bilirubin 1.1 0.3 - 1.2 mg/dL   GFR calc non Af Amer >60 >60 mL/min   GFR calc Af Amer >60 >60 mL/min   Anion gap 8 5 - 15  CBC  Result Value Ref Range   WBC 17.7 (H) 4.0 - 10.5 K/uL   RBC 4.40 3.87 - 5.11 MIL/uL   Hemoglobin 13.4 12.0 - 15.0 g/dL   HCT 40.4 36.0  - 46.0 %   MCV 91.8 78.0 - 100.0 fL   MCH 30.5 26.0 - 34.0 pg   MCHC 33.2 30.0 - 36.0 g/dL   RDW 13.7 11.5 - 15.5 %   Platelets 287 150 - 400 K/uL  Urinalysis, Routine w reflex microscopic  Result Value Ref Range   Color, Urine YELLOW YELLOW   APPearance CLEAR CLEAR   Specific Gravity, Urine 1.025 1.005 - 1.030   pH 5.5 5.0 - 8.0   Glucose, UA NEGATIVE NEGATIVE mg/dL   Hgb urine dipstick SMALL (A) NEGATIVE   Bilirubin Urine NEGATIVE NEGATIVE   Ketones, ur NEGATIVE NEGATIVE mg/dL   Protein, ur NEGATIVE NEGATIVE mg/dL   Nitrite NEGATIVE NEGATIVE   Leukocytes,  UA SMALL (A) NEGATIVE  Urine microscopic-add on  Result Value Ref Range   Squamous Epithelial / LPF 6-30 (A) NONE SEEN   WBC, UA 6-30 0 - 5 WBC/hpf   RBC / HPF 0-5 0 - 5 RBC/hpf   Bacteria, UA MANY (A) NONE SEEN  Lactic acid, plasma  Result Value Ref Range   Lactic Acid, Venous 0.8 0.5 - 2.0 mmol/L   Dg Chest 2 View 06/08/2016  CLINICAL DATA:  Acute onset of lower abdominal pain and diarrhea. Initial encounter. EXAM: CHEST  2 VIEW COMPARISON:  None. FINDINGS: The lungs are well-aerated and clear. There is no evidence of focal opacification, pleural effusion or pneumothorax. The heart is normal in size; the mediastinal contour is within normal limits. No acute osseous abnormalities are seen. IMPRESSION: No acute cardiopulmonary process seen. Electronically Signed   By: Garald Balding M.D.   On: 06/08/2016 23:36   Ct Abdomen Pelvis W Contrast 06/09/2016  CLINICAL DATA:  Acute onset of generalized abdominal pain and rebound tenderness. Increased urinary urgency. Initial encounter. EXAM: CT ABDOMEN AND PELVIS WITH CONTRAST TECHNIQUE: Multidetector CT imaging of the abdomen and pelvis was performed using the standard protocol following bolus administration of intravenous contrast. CONTRAST:  127mL ISOVUE-300 IOPAMIDOL (ISOVUE-300) INJECTION 61% COMPARISON:  None. FINDINGS: The visualized lung bases are clear. A tiny hiatal hernia is  noted. Scattered coronary artery calcifications are seen. Mild calcification is noted at the aortic valve. The liver is unremarkable in appearance. The spleen is mildly enlarged, measuring 13.9 cm in length. The gallbladder is within normal limits. The pancreas and left adrenal gland are unremarkable. A 2.8 cm right adrenal adenoma is noted. The kidneys are unremarkable in appearance. There is no evidence of hydronephrosis. No renal or ureteral stones are seen. No perinephric stranding is appreciated. The small bowel is unremarkable in appearance. The stomach is within normal limits. No acute vascular abnormalities are seen. The appendix is normal in caliber, without evidence of appendicitis. Contrast progresses to the level of the splenic flexure of the colon. A relatively long segment of soft tissue inflammation is noted along the proximal and mid sigmoid colon, with associated wall thickening. Underlying diverticulosis is noted along the descending and sigmoid colon. Findings are concerning for a relatively long segment of acute diverticulitis. Trace associated free fluid is seen. No definite perforation or abscess formation is seen at this time. The bladder is moderately distended and grossly unremarkable. Minimal calcifications are seen within the uterus. The ovaries are relatively symmetric. No suspicious adnexal masses are seen. No inguinal lymphadenopathy is seen. No acute osseous abnormalities are identified. There is mild grade 1 retrolisthesis of L2 on L3 and of L3 on L4, with multilevel vacuum phenomenon. Underlying facet disease is noted. IMPRESSION: 1. Relatively long segment of soft tissue inflammation along the proximal and mid sigmoid colon, with associated wall thickening. Underlying diverticulosis along the descending and sigmoid colon. Findings concerning for a relatively long segment of acute diverticulitis. Trace associated free fluid seen. No evidence of perforation or abscess formation. 2.  Tiny hiatal hernia noted. 3. Scattered coronary artery calcifications seen. Mild calcification at the aortic valve. 4. Mild splenomegaly. 5. 2.8 cm right adrenal adenoma noted. 6. Mild degenerative change along the lumbar spine. Electronically Signed   By: Garald Balding M.D.   On: 06/09/2016 00:02    0010:  Udip appears contaminated, though given pt's symptoms, will obtain UC. CT with acute diverticulitis; will dose IV cipro/flagyl. Dx and testing d/w pt and  family.  Questions answered.  Verb understanding, agreeable to admit. T/C to [facility] Dr. Myna Hidalgo, case discussed, including:  HPI, pertinent PM/SHx, VS/PE, dx testing, ED course and treatment:  Agreeable to admit, requests to write temporary orders, obtain medical bed to team APAdmits.   Francine Graven, DO 06/10/16 [phone]

## 2016-06-08 NOTE — ED Notes (Signed)
Pt referred to ER by [facility] for urinary urgency and abd pain with rebound tenderness. Pt reports has had symptoms x 3 days.  Reports loose stool during the night last night.  Pt says the night before that, she was incontinent of urine before she could get to the bathroom.

## 2016-06-09 ENCOUNTER — Encounter (HOSPITAL_COMMUNITY): Payer: Self-pay | Admitting: Family Medicine

## 2016-06-09 DIAGNOSIS — I10 Essential (primary) hypertension: Secondary | ICD-10-CM

## 2016-06-09 DIAGNOSIS — K5732 Diverticulitis of large intestine without perforation or abscess without bleeding: Secondary | ICD-10-CM | POA: Diagnosis not present

## 2016-06-09 DIAGNOSIS — K5792 Diverticulitis of intestine, part unspecified, without perforation or abscess without bleeding: Secondary | ICD-10-CM | POA: Diagnosis present

## 2016-06-09 DIAGNOSIS — J4540 Moderate persistent asthma, uncomplicated: Secondary | ICD-10-CM

## 2016-06-09 LAB — BASIC METABOLIC PANEL
ANION GAP: 7 (ref 5–15)
BUN: 8 mg/dL (ref 6–20)
CALCIUM: 8.8 mg/dL — AB (ref 8.9–10.3)
CO2: 25 mmol/L (ref 22–32)
Chloride: 104 mmol/L (ref 101–111)
Creatinine, Ser: 0.58 mg/dL (ref 0.44–1.00)
GFR calc Af Amer: >60 mL/min (ref >60)
GLUCOSE: 113 mg/dL — AB (ref 65–99)
Potassium: 3.6 mmol/L (ref 3.5–5.1)
SODIUM: 136 mmol/L (ref 135–145)

## 2016-06-09 LAB — GLUCOSE, CAPILLARY: Glucose-Capillary: 113 mg/dL — ABNORMAL HIGH (ref 65–99)

## 2016-06-09 LAB — PROCALCITONIN

## 2016-06-09 MED ORDER — B COMPLEX-C PO TABS
1.0000 | ORAL_TABLET | Freq: Every day | ORAL | Status: DC
  Administered 2016-06-09 – 2016-06-10 (×2): 1 via ORAL
  Filled 2016-06-09 (×4): qty 1

## 2016-06-09 MED ORDER — VITAMIN D 1000 UNITS PO TABS
2000.0000 [IU] | ORAL_TABLET | Freq: Every day | ORAL | Status: DC
  Administered 2016-06-09 – 2016-06-10 (×2): 2000 [IU] via ORAL
  Filled 2016-06-09 (×2): qty 2

## 2016-06-09 MED ORDER — VITAMIN D2 50 MCG (2000 UT) PO TABS: 1.0000 | ORAL_TABLET | Freq: Every day | ORAL | Status: DC

## 2016-06-09 MED ORDER — ACETAMINOPHEN 325 MG PO TABS
650.0000 mg | ORAL_TABLET | Freq: Four times a day (QID) | ORAL | Status: DC | PRN
  Administered 2016-06-09: 650 mg via ORAL
  Filled 2016-06-09: qty 2

## 2016-06-09 MED ORDER — HYDROCODONE-ACETAMINOPHEN 5-325 MG PO TABS: 1.0000 | ORAL_TABLET | ORAL | Status: DC | PRN

## 2016-06-09 MED ORDER — POTASSIUM CHLORIDE IN NACL 20-0.9 MEQ/L-% IV SOLN
INTRAVENOUS | Status: AC
  Administered 2016-06-09: 03:00:00 via INTRAVENOUS

## 2016-06-09 MED ORDER — ACETAMINOPHEN 650 MG RE SUPP: 650.0000 mg | Freq: Four times a day (QID) | RECTAL | Status: DC | PRN

## 2016-06-09 MED ORDER — METRONIDAZOLE IN NACL 5-0.79 MG/ML-% IV SOLN
500.0000 mg | Freq: Once | INTRAVENOUS | Status: AC
  Administered 2016-06-09: 500 mg via INTRAVENOUS
  Filled 2016-06-09: qty 100

## 2016-06-09 MED ORDER — ADULT MULTIVITAMIN W/MINERALS CH
1.0000 | ORAL_TABLET | Freq: Every day | ORAL | Status: DC
  Administered 2016-06-09 – 2016-06-10 (×2): 1 via ORAL
  Filled 2016-06-09 (×2): qty 1

## 2016-06-09 MED ORDER — ENOXAPARIN SODIUM 40 MG/0.4ML ~~LOC~~ SOLN
40.0000 mg | SUBCUTANEOUS | Status: DC
  Administered 2016-06-09 – 2016-06-10 (×2): 40 mg via SUBCUTANEOUS
  Filled 2016-06-09 (×2): qty 0.4

## 2016-06-09 MED ORDER — METRONIDAZOLE IN NACL 5-0.79 MG/ML-% IV SOLN
500.0000 mg | Freq: Three times a day (TID) | INTRAVENOUS | Status: DC
  Administered 2016-06-09 – 2016-06-10 (×5): 500 mg via INTRAVENOUS
  Filled 2016-06-09 (×5): qty 100

## 2016-06-09 MED ORDER — ONDANSETRON HCL 4 MG/2ML IJ SOLN: 4.0000 mg | Freq: Four times a day (QID) | INTRAMUSCULAR | Status: DC | PRN

## 2016-06-09 MED ORDER — HYDRALAZINE HCL 20 MG/ML IJ SOLN: 10.0000 mg | INTRAMUSCULAR | Status: DC | PRN

## 2016-06-09 MED ORDER — ONDANSETRON HCL 4 MG PO TABS: 4.0000 mg | ORAL_TABLET | Freq: Four times a day (QID) | ORAL | Status: DC | PRN

## 2016-06-09 MED ORDER — CIPROFLOXACIN IN D5W 400 MG/200ML IV SOLN
400.0000 mg | Freq: Once | INTRAVENOUS | Status: AC
  Administered 2016-06-09: 400 mg via INTRAVENOUS
  Filled 2016-06-09: qty 200

## 2016-06-09 MED ORDER — MAGNESIUM OXIDE 400 (241.3 MG) MG PO TABS
200.0000 mg | ORAL_TABLET | Freq: Every day | ORAL | Status: DC
  Administered 2016-06-09 – 2016-06-10 (×2): 200 mg via ORAL
  Filled 2016-06-09 (×2): qty 1

## 2016-06-09 MED ORDER — HYDROMORPHONE HCL 1 MG/ML IJ SOLN
0.5000 mg | INTRAMUSCULAR | Status: DC | PRN
Start: 2016-06-09 — End: 2016-06-10

## 2016-06-09 MED ORDER — ALBUTEROL SULFATE (2.5 MG/3ML) 0.083% IN NEBU: 3.0000 mL | INHALATION_SOLUTION | Freq: Four times a day (QID) | RESPIRATORY_TRACT | Status: DC | PRN

## 2016-06-09 MED ORDER — CIPROFLOXACIN IN D5W 400 MG/200ML IV SOLN
400.0000 mg | Freq: Two times a day (BID) | INTRAVENOUS | Status: DC
  Administered 2016-06-09 – 2016-06-10 (×3): 400 mg via INTRAVENOUS
  Filled 2016-06-09 (×3): qty 200

## 2016-06-09 NOTE — Care Management Note (Signed)
Case Management Note  Patient Details  Name: Barbara Savage MRN: [id number] Date of Birth: 1941-03-29  Subjective/Objective:                  Pt admitted with colitis. Pt is from home, lives with husband and is ind with ADL's. Pt has PCP, transportation and no difficulty affording medications. Pt works full time. Pt plans to return home with self care.   Action/Plan: No CM needs.   Expected Discharge Date:    06/11/2016              Expected Discharge Plan:  Home/Self Care  In-House Referral:  NA  Discharge planning Services  CM Consult  Post Acute Care Choice:  NA Choice offered to:  NA  DME Arranged:    DME Agency:     HH Arranged:    HH Agency:     Status of Service:  Completed, signed off  Medicare Important Message Given:    Date Medicare IM Given:    Medicare IM give by:    Date Additional Medicare IM Given:    Additional Medicare Important Message give by:     If discussed at [place] of Stay Meetings, dates discussed:    Additional Comments:  Sherald Barge, RN 06/09/2016, 2:36 PM

## 2016-06-09 NOTE — Progress Notes (Signed)
Charted in error.

## 2016-06-09 NOTE — Progress Notes (Addendum)
Patient seen and examined  75 y.o. female with medical history significant for hypertension, asthma, and self-diagnosed gluten intolerance who presents to the ED with 3 days of abdominal pain.CT of the abdomen and pelvis was obtained and notable for a long segment of inflammation along the proximal and mid sigmoid with associated wall thickening and underlying diverticula suggestive of acute diverticulitis. There is trace free fluid appreciated on the CT, but no evidence for perforation or abscess. Patient has never had a colonoscopy  Plan  1. Acute sigmoid diverticulitis  - CT findings suggest acute sigmoid diverticulitis without perforation or abscess  - Leukocytosis to 17,700 on admission - Afebrile, lactate wnl, no end-organ damage  - Empiric treatment with ciprofloxacin and Flagyl initiated in ED, will continue  Continue clear liquids , recommended patient to have outpatient colonoscopy  2. Hypertension - Elevated to 160/96 on presentation; pain may be contributing  - Has documented Hx of HTN, but not on any treatment  - Monitor; hydralazine IVP's available prn   3. Asthma, moderate persistent  - Stable at time of admission with no suggestion of exacerbation  - Continue current management with prn albuterol MDI

## 2016-06-09 NOTE — Care Management Obs Status (Signed)
[facility] NOTIFICATION   Patient Details  Name: Barbara Savage MRN: [id number] Date of Birth: 11/28/1941   Medicare Observation Status Notification Given:  Yes    Sherald Barge, RN 06/09/2016, 2:23 PM

## 2016-06-09 NOTE — H&P (Signed)
History and Physical    Barbara Savage [id number] DOB: 11/13/1941 DOA: 06/08/2016  PCP: Albina Billet, MD   Patient coming from: Home   Chief Complaint: Lower abdominal pain  HPI: Barbara Savage is a 76 y.o. female with medical history significant for hypertension, asthma, and self-diagnosed gluten intolerance who presents to the ED with 3 days of abdominal pain. Patient reports that she was in her usual state until approximately 3 days ago when she noted the insidious development of pain in the lower abdominal quadrants. There was no associated fevers or chills, but she endorses nausea and loose stools last night, but no vomiting. She has never had a colonoscopy but reports experiencing similar symptoms previously which reportedly resolved when she began avoiding gluten. Patient denies chest pain, palpitations, dyspnea, or cough. She also denies any melena or hematochezia. She denies history of abdominal surgery.   ED Course: Upon arrival to the ED, patient is found to be have temperature 37.7 C, saturating well on room air, tachycardic in the low 100s, and with vitals otherwise stable. Chest x-ray was negative for acute cardiopulmonary disease, CMP features a mild hyponatremia, and CBC is notable for a leukocytosis to 17,700. Lactic acid is reassuring at 0.8. Urinalysis features many bacteria, small leukocyte, negative nitrite, and 6-30 WBC. CT of the abdomen and pelvis was obtained and notable for a long segment of inflammation along the proximal and mid sigmoid with associated wall thickening and underlying diverticula suggestive of acute diverticulitis. There is trace free fluid appreciated on the CT, but no evidence for perforation or abscess. Urine was sent for culture, patient was started on IV fluids at 100 mL normal saline per hour, and empiric treatment with Flagyl and ciprofloxacin was initiated. Patient has remained hemodynamically stable in the emergency department and will be  admitted to the hospital for ongoing evaluation and management of acute diverticulitis.  Review of Systems:  All other systems reviewed and apart from HPI, are negative.  Past Medical History  Diagnosis Date  . Sciatica   . Asthma   . Celiac disease     Past Surgical History  Procedure Laterality Date  . Tonsillectomy       reports that she quit smoking about 37 years ago. Her smoking use included Cigarettes. She has a 25 pack-year smoking history. She has never used smokeless tobacco. She reports that she does not drink alcohol or use illicit drugs.  Allergies  Allergen Reactions  . Breo Ellipta [Fluticasone Furoate-Vilanterol]     Worsens asthmatic symptoms  . Mobic [Meloxicam]   . Penicillins Rash    Has patient had a PCN reaction causing immediate rash, facial/tongue/throat swelling, SOB or lightheadedness with hypotension: Yes Has patient had a PCN reaction causing severe rash involving mucus membranes or skin necrosis: No Has patient had a PCN reaction that required hospitalization No Has patient had a PCN reaction occurring within the last 10 years: No If all of the above answers are "NO", then may proceed with Cephalosporin use.     History reviewed. No pertinent family history.   Prior to Admission medications   Medication Sig Start Date End Date Taking? Authorizing Provider  B Complex Vitamins (B COMPLEX 50 PO) Take 1 tablet by mouth daily. Reported on 03/14/2016   Yes Historical Provider, MD  Ergocalciferol (VITAMIN D2) 2000 units TABS Take 1 tablet by mouth daily.   Yes Historical Provider, MD  Lutein 20 MG CAPS Take 1 capsule by mouth daily. Reported on 03/14/2016  Yes Historical Provider, MD  Magnesium 200 MG TABS Take 1 tablet by mouth daily. Reported on 03/14/2016   Yes Historical Provider, MD  Multiple Vitamin (MULTIVITAMIN WITH MINERALS) TABS tablet Take 1 tablet by mouth daily.   Yes Historical Provider, MD  PROAIR HFA 108 [phone] Base) MCG/ACT inhaler Inhale 2  puffs into the lungs every 6 (six) hours as needed for wheezing or shortness of breath.  12/07/15  Yes Historical Provider, MD  Pseudoephedrine-Acetaminophen (SINUS RELIEF PO) Take 1 tablet by mouth daily as needed (for sinus relief).   Yes Historical Provider, MD  Quercetin 250 MG TABS Take 1 tablet by mouth daily. For allergy health   Yes Historical Provider, MD    Physical Exam: Filed Vitals:   06/08/16 1712 06/08/16 1847 06/08/16 2004 06/08/16 2326  BP: 160/96  146/80 136/89  Pulse: 109  104 87  Temp: 99.8 F (37.7 C) 99.4 F (37.4 C) 99.2 F (37.3 C)   TempSrc: Oral Oral Oral   Resp: 18  20 18   Height: 5\' 6"  (1.676 m)     Weight: 90.719 kg (200 lb)     SpO2: 95%  95% 99%      Constitutional: NAD, calm, comfortable Eyes: PERTLA, lids and conjunctivae normal ENMT: Mucous membranes are moist. Posterior pharynx clear of any exudate or lesions.   Neck: normal, supple, no masses, no thyromegaly Respiratory: clear to auscultation bilaterally, no wheezing, no crackles. Normal respiratory effort.    Cardiovascular: S1 & S2 heard, regular rate and rhythm, no significant murmurs / rubs / gallops. No extremity edema. 2+ pedal pulses.   Abdomen: No distension; tender throughout, particularly in LLQ; no masses palpated. Bowel sounds normal.  Musculoskeletal: no clubbing / cyanosis. No joint deformity upper and lower extremities. Normal muscle tone.  Skin: no significant rashes, lesions, ulcers. Warm, dry, well-perfused. Neurologic: CN 2-12 grossly intact. Sensation intact, DTR normal. Strength 5/5 in all 4 limbs.  Psychiatric: Normal judgment and insight. Alert and oriented x 3. Normal mood and affect.     Labs on Admission: I have personally reviewed following labs and imaging studies  CBC:  Recent Labs Lab 06/08/16 2136  WBC 17.7*  HGB 13.4  HCT 40.4  MCV 91.8  PLT [id number]   Basic Metabolic Panel:  Recent Labs Lab 06/08/16 2136  NA 133*  K 3.5  CL 102  CO2 23  GLUCOSE  109*  BUN 10  CREATININE 0.65  CALCIUM 8.9   GFR: Estimated Creatinine Clearance: 72.1 mL/min (by C-G formula based on Cr of 0.65). Liver Function Tests:  Recent Labs Lab 06/08/16 2136  AST 19  ALT 20  ALKPHOS 78  BILITOT 1.1  PROT 7.6  ALBUMIN 4.1    Recent Labs Lab 06/08/16 2136  LIPASE 20   No results for input(s): AMMONIA in the last 168 hours. Coagulation Profile: No results for input(s): INR, PROTIME in the last 168 hours. Cardiac Enzymes: No results for input(s): CKTOTAL, CKMB, CKMBINDEX, TROPONINI in the last 168 hours. BNP (last 3 results) No results for input(s): PROBNP in the last 8760 hours. HbA1C: No results for input(s): HGBA1C in the last 72 hours. CBG: No results for input(s): GLUCAP in the last 168 hours. Lipid Profile: No results for input(s): CHOL, HDL, LDLCALC, TRIG, CHOLHDL, LDLDIRECT in the last 72 hours. Thyroid Function Tests: No results for input(s): TSH, T4TOTAL, FREET4, T3FREE, THYROIDAB in the last 72 hours. Anemia Panel: No results for input(s): VITAMINB12, FOLATE, FERRITIN, TIBC, IRON, RETICCTPCT in the last  72 hours. Urine analysis:    Component Value Date/Time   COLORURINE YELLOW 06/08/2016 [place] 06/08/2016 1715   LABSPEC 1.025 06/08/2016 1715   PHURINE 5.5 06/08/2016 [place] 06/08/2016 1715   HGBUR SMALL* 06/08/2016 [place] 06/08/2016 [place] 06/08/2016 1715   PROTEINUR NEGATIVE 06/08/2016 1715   NITRITE NEGATIVE 06/08/2016 1715   LEUKOCYTESUR SMALL* 06/08/2016 1715   Sepsis Labs: @LABRCNTIP (procalcitonin:4,lacticidven:4) )No results found for this or any previous visit (from the past 240 hour(s)).   Radiological Exams on Admission: Dg Chest 2 View  06/08/2016  CLINICAL DATA:  Acute onset of lower abdominal pain and diarrhea. Initial encounter. EXAM: CHEST  2 VIEW COMPARISON:  None. FINDINGS: The lungs are well-aerated and clear. There is no  evidence of focal opacification, pleural effusion or pneumothorax. The heart is normal in size; the mediastinal contour is within normal limits. No acute osseous abnormalities are seen. IMPRESSION: No acute cardiopulmonary process seen. Electronically Signed   By: Garald Balding M.D.   On: 06/08/2016 23:36   Ct Abdomen Pelvis W Contrast  06/09/2016  CLINICAL DATA:  Acute onset of generalized abdominal pain and rebound tenderness. Increased urinary urgency. Initial encounter. EXAM: CT ABDOMEN AND PELVIS WITH CONTRAST TECHNIQUE: Multidetector CT imaging of the abdomen and pelvis was performed using the standard protocol following bolus administration of intravenous contrast. CONTRAST:  151mL ISOVUE-300 IOPAMIDOL (ISOVUE-300) INJECTION 61% COMPARISON:  None. FINDINGS: The visualized lung bases are clear. A tiny hiatal hernia is noted. Scattered coronary artery calcifications are seen. Mild calcification is noted at the aortic valve. The liver is unremarkable in appearance. The spleen is mildly enlarged, measuring 13.9 cm in length. The gallbladder is within normal limits. The pancreas and left adrenal gland are unremarkable. A 2.8 cm right adrenal adenoma is noted. The kidneys are unremarkable in appearance. There is no evidence of hydronephrosis. No renal or ureteral stones are seen. No perinephric stranding is appreciated. The small bowel is unremarkable in appearance. The stomach is within normal limits. No acute vascular abnormalities are seen. The appendix is normal in caliber, without evidence of appendicitis. Contrast progresses to the level of the splenic flexure of the colon. A relatively long segment of soft tissue inflammation is noted along the proximal and mid sigmoid colon, with associated wall thickening. Underlying diverticulosis is noted along the descending and sigmoid colon. Findings are concerning for a relatively long segment of acute diverticulitis. Trace associated free fluid is seen. No  definite perforation or abscess formation is seen at this time. The bladder is moderately distended and grossly unremarkable. Minimal calcifications are seen within the uterus. The ovaries are relatively symmetric. No suspicious adnexal masses are seen. No inguinal lymphadenopathy is seen. No acute osseous abnormalities are identified. There is mild grade 1 retrolisthesis of L2 on L3 and of L3 on L4, with multilevel vacuum phenomenon. Underlying facet disease is noted. IMPRESSION: 1. Relatively long segment of soft tissue inflammation along the proximal and mid sigmoid colon, with associated wall thickening. Underlying diverticulosis along the descending and sigmoid colon. Findings concerning for a relatively long segment of acute diverticulitis. Trace associated free fluid seen. No evidence of perforation or abscess formation. 2. Tiny hiatal hernia noted. 3. Scattered coronary artery calcifications seen. Mild calcification at the aortic valve. 4. Mild splenomegaly. 5. 2.8 cm right adrenal adenoma noted. 6. Mild degenerative change along the lumbar spine. Electronically Signed   By: Francoise Schaumann.D.  On: 06/09/2016 00:02    EKG: Not performed, will obtain as appropriate.   Assessment/Plan  1. Acute sigmoid diverticulitis  - CT findings suggest acute sigmoid diverticulitis without perforation or abscess  - Leukocytosis to 17,700 on admission - Afebrile, lactate wnl, no end-organ damage  - Empiric treatment with ciprofloxacin and Flagyl initiated in ED, will continue  - Follow clinical progress   2. Hypertension - Elevated to 160/96 on presentation; pain may be contributing  - Has documented Hx of HTN, but not on any treatment  - Monitor; hydralazine IVP's available prn   3. Asthma, moderate persistent  - Stable at time of admission with no suggestion of exacerbation  - Continue current management with prn albuterol MDI    DVT prophylaxis: sq Lovenox  Code Status: Full  Family  Communication: Son and husband updated at bedside   Disposition Plan: Observe on med-surg   Consults called: None   Admission status: Observation     Vianne Bulls, MD [facility] Pager [phone]  If 7PM-7AM, please contact night-coverage [email or web address] Password [email or web address]  06/09/2016, 1:33 AM

## 2016-06-10 DIAGNOSIS — I10 Essential (primary) hypertension: Secondary | ICD-10-CM | POA: Diagnosis not present

## 2016-06-10 DIAGNOSIS — J4540 Moderate persistent asthma, uncomplicated: Secondary | ICD-10-CM | POA: Diagnosis not present

## 2016-06-10 DIAGNOSIS — K5792 Diverticulitis of intestine, part unspecified, without perforation or abscess without bleeding: Secondary | ICD-10-CM | POA: Diagnosis not present

## 2016-06-10 LAB — URINE CULTURE: Culture: 50000 — AB

## 2016-06-10 LAB — CBC
HCT: 41.5 % (ref 36.0–46.0)
Hemoglobin: 13.3 g/dL (ref 12.0–15.0)
MCH: 30.1 pg (ref 26.0–34.0)
MCHC: 32 g/dL (ref 30.0–36.0)
MCV: 93.9 fL (ref 78.0–100.0)
PLATELETS: 327 10*3/uL (ref 150–400)
RBC: 4.42 MIL/uL (ref 3.87–5.11)
RDW: 13.8 % (ref 11.5–15.5)
WBC: 10.6 10*3/uL — ABNORMAL HIGH (ref 4.0–10.5)

## 2016-06-10 LAB — COMPREHENSIVE METABOLIC PANEL
ALT: 19 U/L (ref 14–54)
ANION GAP: 7 (ref 5–15)
AST: 21 U/L (ref 15–41)
Albumin: 3.9 g/dL (ref 3.5–5.0)
Alkaline Phosphatase: 77 U/L (ref 38–126)
BUN: 6 mg/dL (ref 6–20)
CALCIUM: 9.3 mg/dL (ref 8.9–10.3)
CHLORIDE: 107 mmol/L (ref 101–111)
CO2: 25 mmol/L (ref 22–32)
Creatinine, Ser: 0.67 mg/dL (ref 0.44–1.00)
GFR calc non Af Amer: >60 mL/min (ref >60)
Glucose, Bld: 111 mg/dL — ABNORMAL HIGH (ref 65–99)
POTASSIUM: 4.1 mmol/L (ref 3.5–5.1)
SODIUM: 139 mmol/L (ref 135–145)
Total Bilirubin: 0.5 mg/dL (ref 0.3–1.2)
Total Protein: 7.5 g/dL (ref 6.5–8.1)

## 2016-06-10 LAB — GLUCOSE, CAPILLARY: Glucose-Capillary: 107 mg/dL — ABNORMAL HIGH (ref 65–99)

## 2016-06-10 MED ORDER — ONDANSETRON 4 MG PO TBDP: 4.0000 mg | ORAL_TABLET | Freq: Three times a day (TID) | ORAL | Status: DC | PRN

## 2016-06-10 MED ORDER — METRONIDAZOLE 500 MG PO TABS: 500.0000 mg | ORAL_TABLET | Freq: Three times a day (TID) | ORAL | Status: AC

## 2016-06-10 MED ORDER — CIPROFLOXACIN HCL 500 MG PO TABS: 500.0000 mg | ORAL_TABLET | Freq: Two times a day (BID) | ORAL | Status: DC

## 2016-06-10 NOTE — Discharge Summary (Signed)
Physician Discharge Summary  [name] [id number] DOB: 1941/06/08 DOA: 06/08/2016  PCP: Albina Billet, MD  Admit date: 06/08/2016 Discharge date: 06/10/2016  Time spent: 30 minutes  Recommendations for Outpatient Follow-up:  F/u with PCP, early  Discharge Diagnoses:  Principal Problem:   Acute diverticulitis Active Problems:   Moderate persistent chronic asthma without complication   Essential hypertension   Discharge Condition: stable  Diet recommendation: bland low salt/heart healthy diet  Filed Weights   06/08/16 1712 06/09/16 0233 06/10/16 0500  Weight: 90.719 kg (200 lb) 90.8 kg (200 lb 2.8 oz) 90.2 kg (198 lb 13.7 oz)    History of present illness:  Barbara Savage is a 75 y.o. female with medical history significant for hypertension, asthma, and self-diagnosed gluten intolerance who presents to the ED with 3 days of abdominal pain. Patient reports that she was in her usual state until approximately 3 days ago when she noted the insidious development of pain in the lower abdominal quadrants. There was no associated fevers or chills, but she endorses nausea and loose stools last night, but no vomiting. She has never had a colonoscopy but reports experiencing similar symptoms previously which reportedly resolved when she began avoiding gluten. Patient denies chest pain, palpitations, dyspnea, or cough. She also denies any melena or hematochezia. She denies history of abdominal surgery.   Hospital Course:  1. Acute sigmoid diverticulitis  - CT findings suggest acute sigmoid diverticulitis without perforation or abscess  - Leukocytosis to 17,700 on admission - Afebrile, lactate wnl, no end-organ damage  - Empiric treatment with ciprofloxacin and Flagyl initiated in ED and continued - Continue clear liquids and was advanced, pt tolerated semi-soft diet well - No diarrhea  2. Hypertension - Elevated to 160/96 on presentation; pain may be contributing  - Has  documented Hx of HTN, but not on any treatment  - Monitor; hydralazine IVP's available prn   3. Asthma, moderate persistent  - Stable at time of admission with no suggestion of exacerbation  - Continued OP management with prn albuterol MDI - No issues  Procedures:  none (i.e. Studies not automatically included, echos, thoracentesis, etc; not x-rays)  Consultations:  none  Discharge Exam: Filed Vitals:   06/10/16 0500 06/10/16 1300  BP: 102/44 126/77  Pulse: 68 87  Temp: 97.4 F (36.3 C) 98 F (36.7 C)  Resp: 20 20     General:  No diaphoresis, anxious, no acute distress  Cardiovascular: Regular rate and rhythm no murmurs rubs or gallops  Respiratory: Clear to auscultation bilaterally no more breathing  Abdomen: Nondistended bowel sounds normal nontender palpation  Musculoskeletal: Moving all extremities, no deformity, 5 out of 5 strength   Discharge Instructions   Discharge Instructions    Discharge patient    Complete by:  As directed   If pt passes PO challenge          Current Discharge Medication List    START taking these medications   Details  ciprofloxacin (CIPRO) 500 MG tablet Take 1 tablet (500 mg total) by mouth 2 (two) times daily. Qty: 16 tablet, Refills: 0    metroNIDAZOLE (FLAGYL) 500 MG tablet Take 1 tablet (500 mg total) by mouth 3 (three) times daily. Qty: 24 tablet, Refills: 0    ondansetron (ZOFRAN ODT) 4 MG disintegrating tablet Take 1 tablet (4 mg total) by mouth every 8 (eight) hours as needed for nausea or vomiting. Qty: 20 tablet, Refills: 0      CONTINUE these medications which  have NOT CHANGED   Details  B Complex Vitamins (B COMPLEX 50 PO) Take 1 tablet by mouth daily. Reported on 03/14/2016    Ergocalciferol (VITAMIN D2) 2000 units TABS Take 1 tablet by mouth daily.    Lutein 20 MG CAPS Take 1 capsule by mouth daily. Reported on 03/14/2016    Magnesium 200 MG TABS Take 1 tablet by mouth daily. Reported on 03/14/2016     Multiple Vitamin (MULTIVITAMIN WITH MINERALS) TABS tablet Take 1 tablet by mouth daily.    PROAIR HFA 108 (90 Base) MCG/ACT inhaler Inhale 2 puffs into the lungs every 6 (six) hours as needed for wheezing or shortness of breath.     Pseudoephedrine-Acetaminophen (SINUS RELIEF PO) Take 1 tablet by mouth daily as needed (for sinus relief).    Quercetin 250 MG TABS Take 1 tablet by mouth daily. For allergy health       Allergies  Allergen Reactions  . Breo Ellipta [Fluticasone Furoate-Vilanterol]     Worsens asthmatic symptoms  . Mobic [Meloxicam]   . Penicillins Rash    Has patient had a PCN reaction causing immediate rash, facial/tongue/throat swelling, SOB or lightheadedness with hypotension: Yes Has patient had a PCN reaction causing severe rash involving mucus membranes or skin necrosis: No Has patient had a PCN reaction that required hospitalization No Has patient had a PCN reaction occurring within the last 10 years: No If all of the above answers are "NO", then may proceed with Cephalosporin use.    Follow-up Information    Follow up with Albina Billet, MD On 06/14/2016.   Specialty:  Internal Medicine   Why:  diverticulitis   Contact information:   [address]   [place] [address] [phone]        The results of significant diagnostics from this hospitalization (including imaging, microbiology, ancillary and laboratory) are listed below for reference.    Significant Diagnostic Studies: Dg Chest 2 View  06/08/2016  CLINICAL DATA:  Acute onset of lower abdominal pain and diarrhea. Initial encounter. EXAM: CHEST  2 VIEW COMPARISON:  None. FINDINGS: The lungs are well-aerated and clear. There is no evidence of focal opacification, pleural effusion or pneumothorax. The heart is normal in size; the mediastinal contour is within normal limits. No acute osseous abnormalities are seen. IMPRESSION: No acute cardiopulmonary process seen. Electronically Signed   By:  Garald Balding M.D.   On: 06/08/2016 23:36   Ct Abdomen Pelvis W Contrast  06/09/2016  CLINICAL DATA:  Acute onset of generalized abdominal pain and rebound tenderness. Increased urinary urgency. Initial encounter. EXAM: CT ABDOMEN AND PELVIS WITH CONTRAST TECHNIQUE: Multidetector CT imaging of the abdomen and pelvis was performed using the standard protocol following bolus administration of intravenous contrast. CONTRAST:  114mL ISOVUE-300 IOPAMIDOL (ISOVUE-300) INJECTION 61% COMPARISON:  None. FINDINGS: The visualized lung bases are clear. A tiny hiatal hernia is noted. Scattered coronary artery calcifications are seen. Mild calcification is noted at the aortic valve. The liver is unremarkable in appearance. The spleen is mildly enlarged, measuring 13.9 cm in length. The gallbladder is within normal limits. The pancreas and left adrenal gland are unremarkable. A 2.8 cm right adrenal adenoma is noted. The kidneys are unremarkable in appearance. There is no evidence of hydronephrosis. No renal or ureteral stones are seen. No perinephric stranding is appreciated. The small bowel is unremarkable in appearance. The stomach is within normal limits. No acute vascular abnormalities are seen. The appendix is normal in caliber, without evidence of  appendicitis. Contrast progresses to the level of the splenic flexure of the colon. A relatively long segment of soft tissue inflammation is noted along the proximal and mid sigmoid colon, with associated wall thickening. Underlying diverticulosis is noted along the descending and sigmoid colon. Findings are concerning for a relatively long segment of acute diverticulitis. Trace associated free fluid is seen. No definite perforation or abscess formation is seen at this time. The bladder is moderately distended and grossly unremarkable. Minimal calcifications are seen within the uterus. The ovaries are relatively symmetric. No suspicious adnexal masses are seen. No inguinal  lymphadenopathy is seen. No acute osseous abnormalities are identified. There is mild grade 1 retrolisthesis of L2 on L3 and of L3 on L4, with multilevel vacuum phenomenon. Underlying facet disease is noted. IMPRESSION: 1. Relatively long segment of soft tissue inflammation along the proximal and mid sigmoid colon, with associated wall thickening. Underlying diverticulosis along the descending and sigmoid colon. Findings concerning for a relatively long segment of acute diverticulitis. Trace associated free fluid seen. No evidence of perforation or abscess formation. 2. Tiny hiatal hernia noted. 3. Scattered coronary artery calcifications seen. Mild calcification at the aortic valve. 4. Mild splenomegaly. 5. 2.8 cm right adrenal adenoma noted. 6. Mild degenerative change along the lumbar spine. Electronically Signed   By: Garald Balding M.D.   On: 06/09/2016 00:02    Microbiology: Recent Results (from the past 240 hour(s))  Urine culture     Status: Abnormal   Collection Time: 06/08/16  5:15 PM  Result Value Ref Range Status   Specimen Description URINE, CLEAN CATCH  Final   Special Requests NONE  Final   Culture (A)  Final    50,000 COLONIES/mL LACTOBACILLUS SPECIES Standardized susceptibility testing for this organism is not available. Performed at [facility]    Report Status 06/10/2016 FINAL  Final     Labs: Basic Metabolic Panel:  Recent Labs Lab 06/08/16 2136 06/09/16 0616 06/10/16 0634  NA 133* 136 139  K 3.5 3.6 4.1  CL 102 104 107  CO2 23 25 25   GLUCOSE 109* 113* 111*  BUN 10 8 6   CREATININE 0.65 0.58 0.67  CALCIUM 8.9 8.8* 9.3   Liver Function Tests:  Recent Labs Lab 06/08/16 2136 06/10/16 0634  AST 19 21  ALT 20 19  ALKPHOS 78 77  BILITOT 1.1 0.5  PROT 7.6 7.5  ALBUMIN 4.1 3.9    Recent Labs Lab 06/08/16 2136  LIPASE 20   No results for input(s): AMMONIA in the last 168 hours. CBC:  Recent Labs Lab 06/08/16 2136 06/10/16 0634  WBC 17.7*  10.6*  HGB 13.4 13.3  HCT 40.4 41.5  MCV 91.8 93.9  PLT 287 327   Cardiac Enzymes: No results for input(s): CKTOTAL, CKMB, CKMBINDEX, TROPONINI in the last 168 hours. BNP: BNP (last 3 results) No results for input(s): BNP in the last 8760 hours.  ProBNP (last 3 results) No results for input(s): PROBNP in the last 8760 hours.  CBG:  Recent Labs Lab 06/09/16 0815 06/10/16 0731  GLUCAP 113* 107*       Signed:  Elwin Mocha MD  FACP  [facility] 06/10/2016, 6:07 PM

## 2016-06-10 NOTE — Discharge Instructions (Addendum)
Diverticulitis Diverticulitis is inflammation or infection of small pouches in your colon that form when you have a condition called diverticulosis. The pouches in your colon are called diverticula. Your colon, or large intestine, is where water is absorbed and stool is formed. Complications of diverticulitis can include:  Bleeding.  Severe infection.  Severe pain.  Perforation of your colon.  Obstruction of your colon. CAUSES  Diverticulitis is caused by bacteria. Diverticulitis happens when stool becomes trapped in diverticula. This allows bacteria to grow in the diverticula, which can lead to inflammation and infection. RISK FACTORS People with diverticulosis are at risk for diverticulitis. Eating a diet that does not include enough fiber from fruits and vegetables may make diverticulitis more likely to develop. SYMPTOMS  Symptoms of diverticulitis may include:  Abdominal pain and tenderness. The pain is normally located on the left side of the abdomen, but may occur in other areas.  Fever and chills.  Bloating.  Cramping.  Nausea.  Vomiting.  Constipation.  Diarrhea.  Blood in your stool. DIAGNOSIS  Your health care provider will ask you about your medical history and do a physical exam. You may need to have tests done because many medical conditions can cause the same symptoms as diverticulitis. Tests may include:  Blood tests.  Urine tests.  Imaging tests of the abdomen, including X-rays and CT scans. When your condition is under control, your health care provider may recommend that you have a colonoscopy. A colonoscopy can show how severe your diverticula are and whether something else is causing your symptoms. TREATMENT  Most cases of diverticulitis are mild and can be treated at home. Treatment may include:  Taking over-the-counter pain medicines.  Following a clear liquid diet.  Taking antibiotic medicines by mouth for 7-10 days. More severe cases may  be treated at a hospital. Treatment may include:  Not eating or drinking.  Taking prescription pain medicine.  Receiving antibiotic medicines through an IV tube.  Receiving fluids and nutrition through an IV tube.  Surgery. HOME CARE INSTRUCTIONS   Follow your health care provider's instructions carefully.  Follow a full liquid diet or other diet as directed by your health care provider. After your symptoms improve, your health care provider may tell you to change your diet. He or she may recommend you eat a high-fiber diet. Fruits and vegetables are good sources of fiber. Fiber makes it easier to pass stool.  Take fiber supplements or probiotics as directed by your health care provider.  Only take medicines as directed by your health care provider.  Keep all your follow-up appointments. SEEK MEDICAL CARE IF:   Your pain does not improve.  You have a hard time eating food.  Your bowel movements do not return to normal. SEEK IMMEDIATE MEDICAL CARE IF:   Your pain becomes worse.  Your symptoms do not get better.  Your symptoms suddenly get worse.  You have a fever.  You have repeated vomiting.  You have bloody or black, tarry stools. MAKE SURE YOU:   Understand these instructions.  Will watch your condition.  Will get help right away if you are not doing well or get worse.   This information is not intended to replace advice given to you by your health care provider. Make sure you discuss any questions you have with your health care provider.   Document Released: 09/20/2005 Document Revised: 12/16/2013 Document Reviewed: 11/05/2013 Elsevier Interactive Patient Education 2016 [place] Meal Plan  A soft-food meal plan  includes foods that are safe and easy to swallow. This meal plan typically is used:  If you are having trouble chewing or swallowing foods.  As a transition meal plan after only having had liquid meals for a long period. WHAT DO I  NEED TO KNOW ABOUT THE SOFT-FOOD MEAL PLAN?  A soft-food meal plan includes tender foods that are soft and easy to chew and swallow. In most cases, bite-sized pieces of food are easier to swallow. A bite-sized piece is about  inch or smaller. Foods in this plan do not need to be ground or pureed.  Foods that are very hard, crunchy, or sticky should be avoided. Also, breads, cereals, yogurts, and desserts with nuts, seeds, or fruits should be avoided.  WHAT FOODS CAN I EAT?  Grains  Rice and wild rice. Moist bread, dressing, pasta, and noodles. Well-moistened dry or cooked cereals, such as farina (cooked wheat cereal), oatmeal, or grits. Biscuits, breads, muffins, pancakes, and waffles that have been well moistened.  Vegetables  Shredded lettuce. Cooked, tender vegetables, including potatoes without skins. Vegetable juices. Broths or creamed soups made with vegetables that are not stringy or chewy. Strained tomatoes (without seeds).  Fruits  Canned or well-cooked fruits. Soft (ripe), peeled fresh fruits, such as peaches, nectarines, kiwi, cantaloupe, honeydew melon, and watermelon (without seeds). Soft berries with small seeds, such as strawberries. Fruit juices (without pulp).  Meats and Other Protein Sources  Moist, tender, lean beef. Mutton. Lamb. Veal. Chicken. [place]. Liver. Ham. Fish without bones. Eggs.  Dairy  Milk, milk drinks, and cream. Plain cream cheese and cottage cheese. Plain yogurt.  Sweets/Desserts  Flavored gelatin desserts. Custard. Plain ice cream, frozen yogurt, sherbet, milk shakes, and malts. Plain cakes and cookies. Plain hard candy.  Other  Butter, margarine (without trans fat), and cooking oils. Mayonnaise. Cream sauces. Mild spices, salt, and sugar. Syrup, molasses, honey, and jelly.  The items listed above may not be a complete list of recommended foods or beverages. Contact your dietitian for more options.  WHAT FOODS ARE NOT RECOMMENDED?  Grains  Dry bread, toast,  crackers that have not been moistened. Coarse or dry cereals, such as bran, granola, and shredded wheat. Tough or chewy crusty breads, such as [place] bread or baguettes.  Vegetables  Corn. Raw vegetables except shredded lettuce. Cooked vegetables that are tough or stringy. Tough, crisp, fried potatoes and potato skins.  Fruits  Fresh fruits with skins or seeds or both, such as apples, pears, or grapes. Stringy, high-pulp fruits, such as papaya, pineapple, coconut, or mango. Fruit leather, fruit roll-ups, and all dried fruits.  Meats and Other Protein Sources  Sausages and hot dogs. Meats with gristle. Fish with bones. Nuts, seeds, and chunky peanut or other nut butters.  Sweets/Desserts  Cakes or cookies that are very dry or chewy.  The items listed above may not be a complete list of foods and beverages to avoid. Contact your dietitian for more information.  This information is not intended to replace advice given to you by your health care provider. Make sure you discuss any questions you have with your health care provider.  Document Released: 03/19/2008 Document Revised: 12/16/2013 Document Reviewed: 11/07/2013  Elsevier Interactive Patient Education [employer].

## 2016-06-10 NOTE — Progress Notes (Signed)
Discharged PT per MD order and protocol. Discharge handouts reviewed/explained. Education completed. Prescriptions given and explained.   Pt verbalized understanding and left with all belongings. VSS. IV catheter D/C.  Patient wheeled down by staff member.  

## 2016-07-10 ENCOUNTER — Ambulatory Visit (INDEPENDENT_AMBULATORY_CARE_PROVIDER_SITE_OTHER): Payer: Medicare Other | Admitting: General Surgery

## 2016-07-10 ENCOUNTER — Encounter: Payer: Self-pay | Admitting: General Surgery

## 2016-07-10 VITALS — BP 130/78 | HR 72 | Resp 12 | Ht 67.0 in | Wt 198.0 lb

## 2016-07-10 DIAGNOSIS — K5732 Diverticulitis of large intestine without perforation or abscess without bleeding: Secondary | ICD-10-CM

## 2016-07-10 DIAGNOSIS — Z1211 Encounter for screening for malignant neoplasm of colon: Secondary | ICD-10-CM | POA: Diagnosis not present

## 2016-07-10 DIAGNOSIS — D3501 Benign neoplasm of right adrenal gland: Secondary | ICD-10-CM

## 2016-07-10 MED ORDER — POLYETHYLENE GLYCOL 3350 17 GM/SCOOP PO POWD: ORAL | Status: DC

## 2016-07-10 NOTE — Progress Notes (Signed)
Patient ID: Barbara Savage, female   DOB: 1941-07-14, 75 y.o.   MRN: [id number]  Chief Complaint  Patient presents with  . Other    screening colonoscopy    HPI Barbara Savage is a 75 y.o. female here today for a evaluation of a screening colonoscopy. Patient was seen in the ER on 06/08/16 after having lower abdominal pain for over a week. CT findings suggest acute sigmoid diverticulitis without perforation or abscess she also had a bladder infection at that time. She initially was off of all her supplements after this but has slowly reintroduced them with out any change in her symptoms.    I personally reviewed the patient's history. HPI  Past Medical History  Diagnosis Date  . Sciatica   . Asthma   . Celiac disease   . Diverticulosis     Past Surgical History  Procedure Laterality Date  . Tonsillectomy      Family History  Problem Relation Age of Onset  . Colon cancer Paternal Grandmother 22  . Kidney cancer Maternal Grandmother 80    Social History Social History  Substance Use Topics  . Smoking status: Former Smoker -- 2.50 packs/day for 10 years    Types: Cigarettes    Quit date: 12/25/1978  . Smokeless tobacco: Never Used  . Alcohol Use: No    Allergies  Allergen Reactions  . Breo Ellipta [Fluticasone Furoate-Vilanterol]     Worsens asthmatic symptoms  . Mobic [Meloxicam]   . Penicillins Rash    Has patient had a PCN reaction causing immediate rash, facial/tongue/throat swelling, SOB or lightheadedness with hypotension: Yes Has patient had a PCN reaction causing severe rash involving mucus membranes or skin necrosis: No Has patient had a PCN reaction that required hospitalization No Has patient had a PCN reaction occurring within the last 10 years: No If all of the above answers are "NO", then may proceed with Cephalosporin use.     Current Outpatient Prescriptions  Medication Sig Dispense Refill  . B Complex Vitamins (B COMPLEX 50 PO) Take 1  tablet by mouth daily. Reported on 03/14/2016    . Cholecalciferol (VITAMIN D3) 2000 units TABS Take 1 tablet by mouth daily.    . Lutein 20 MG CAPS Take 1 capsule by mouth daily. Reported on 03/14/2016    . Magnesium 200 MG TABS Take 1 tablet by mouth daily. Reported on 03/14/2016    . Misc Natural Products (GLUCOSAMINE CHONDROITIN MSM PO) Take 1 tablet by mouth daily.    . Misc Natural Products (TART CHERRY ADVANCED) CAPS Take 1 capsule by mouth daily.    . Multiple Vitamin (MULTIVITAMIN WITH MINERALS) TABS tablet Take 1 tablet by mouth daily.    Marland Kitchen PROAIR HFA 108 (90 Base) MCG/ACT inhaler Inhale 2 puffs into the lungs every 6 (six) hours as needed for wheezing or shortness of breath.     . Quercetin 250 MG TABS Take 1 tablet by mouth daily. For allergy health    . TURMERIC PO Take 1 capsule by mouth daily. With black pepper    . polyethylene glycol powder (GLYCOLAX/MIRALAX) powder 255 grams one bottle for colonoscopy prep 255 g 0   No current facility-administered medications for this visit.    Review of Systems Review of Systems  Constitutional: Negative.   HENT: Negative.   Respiratory: Negative.   Cardiovascular: Negative.   Gastrointestinal: Positive for abdominal pain (Hypogastrium).  Endocrine: Negative.   Genitourinary: Negative.  Negative for dysuria and difficulty urinating.  Musculoskeletal: Negative.   Skin: Negative.   Allergic/Immunologic: Negative.   Neurological: Negative.   Hematological: Negative.   Psychiatric/Behavioral: Negative.     Blood pressure 130/78, pulse 72, resp. rate 12, height 5\' 7"  (1.702 m), weight 198 lb (89.812 kg).  Physical Exam Physical Exam  Constitutional: She is oriented to person, place, and time. She appears well-developed and well-nourished.  Eyes: Conjunctivae are normal. No scleral icterus.  Neck: Neck supple.  Cardiovascular: Normal rate, regular rhythm, normal heart sounds and normal pulses.   Pulmonary/Chest: Effort normal and  breath sounds normal.  Abdominal: Soft. Normal appearance and bowel sounds are normal. There is no tenderness.    Lymphadenopathy:    She has no cervical adenopathy.  Neurological: She is alert and oriented to person, place, and time.  Skin: Skin is warm and dry.  Psychiatric: She has a normal mood and affect.    Data Reviewed   June 2017 hospitalization records regarding diverticulitis.  07/09/2016 admission CT results IMPRESSION: 1. Relatively long segment of soft tissue inflammation along the proximal and mid sigmoid colon, with associated wall thickening. Underlying diverticulosis along the descending and sigmoid colon. Findings concerning for a relatively long segment of acute diverticulitis. Trace associated free fluid seen. No evidence of perforation or abscess formation. 2. Tiny hiatal hernia noted. 3. Scattered coronary artery calcifications seen. Mild calcification at the aortic valve. 4. Mild splenomegaly. 5. 2.8 cm right adrenal adenoma noted. 6. Mild degenerative change along the lumbar spine.  CBC on admission showed a white blood cell count of 17,000 improving to 11,000 without discharge.  At the time of admission serum potassium was low normal at 3.5, mild depression of the serum sodium of 133.  Pulmonary assessment of January 2017.  Assessment    Recent episode of diverticulitis, long segment.  Candidate for screening colonoscopy.  Incidental right adrenal adenoma.  While the patient reports a history of low/normal blood pressure, during her hospitalization she did show modest hypertension. The possibility of a hormonally active adenoma is present, even though she showed no significant hypertension during her recent stress with diverticulitis.     Plan    The patient is essentially back to baseline in regards to her abdomen and bowel function, and its reasonable to proceed to colonoscopy for the unlikely possibility of an occult malignancy causing a  long segment episode of diverticulitis.  With the recent identification of the right adrenal adenoma, will encourage her to have urine testing with metanephrines and cauda: Means as well as plasma aldosterone and renin values to exclude a functioning adenoma.    Colonoscopy with possible biopsy/polypectomy prn: Information regarding the procedure, including its potential risks and complications (including but not limited to perforation of the bowel, which may require emergency surgery to repair, and bleeding) was verbally given to the patient. Educational information regarding lower intestinal endoscopy was given to the patient. Written instructions for how to complete the bowel prep using Miralax were provided. The importance of drinking ample fluids to avoid dehydration as a result of the prep emphasized.  Patient has been scheduled for a colonoscopy on 08-30-16 at [facility].   PCP:  Benita Stabile  This information has been scribed by Gaspar Cola CMA.   Robert Bellow 07/11/2016, 9:21 PM

## 2016-07-10 NOTE — Patient Instructions (Signed)
Colonoscopy A colonoscopy is an exam to look at the entire large intestine (colon). This exam can help find problems such as tumors, polyps, inflammation, and areas of bleeding. The exam takes about 1 hour.  LET YOUR HEALTH CARE PROVIDER KNOW ABOUT:   Any allergies you have.  All medicines you are taking, including vitamins, herbs, eye drops, creams, and over-the-counter medicines.  Previous problems you or members of your family have had with the use of anesthetics.  Any blood disorders you have.  Previous surgeries you have had.  Medical conditions you have. RISKS AND COMPLICATIONS  Generally, this is a safe procedure. However, as with any procedure, complications can occur. Possible complications include:  Bleeding.  Tearing or rupture of the colon wall.  Reaction to medicines given during the exam.  Infection (rare). BEFORE THE PROCEDURE   Ask your health care provider about changing or stopping your regular medicines.  You may be prescribed an oral bowel prep. This involves drinking a large amount of medicated liquid, starting the day before your procedure. The liquid will cause you to have multiple loose stools until your stool is almost clear or light green. This cleans out your colon in preparation for the procedure.  Do not eat or drink anything else once you have started the bowel prep, unless your health care provider tells you it is safe to do so.  Arrange for someone to drive you home after the procedure. PROCEDURE   You will be given medicine to help you relax (sedative).  You will lie on your side with your knees bent.  A long, flexible tube with a light and camera on the end (colonoscope) will be inserted through the rectum and into the colon. The camera sends video back to a computer screen as it moves through the colon. The colonoscope also releases carbon dioxide gas to inflate the colon. This helps your health care provider see the area better.  During  the exam, your health care provider may take a small tissue sample (biopsy) to be examined under a microscope if any abnormalities are found.  The exam is finished when the entire colon has been viewed. AFTER THE PROCEDURE   Do not drive for 24 hours after the exam.  You may have a small amount of blood in your stool.  You may pass moderate amounts of gas and have mild abdominal cramping or bloating. This is caused by the gas used to inflate your colon during the exam.  Ask when your test results will be ready and how you will get your results. Make sure you get your test results.   This information is not intended to replace advice given to you by your health care provider. Make sure you discuss any questions you have with your health care provider.   Document Released: 12/08/2000 Document Revised: 10/01/2013 Document Reviewed: 08/18/2013 Elsevier Interactive Patient Education 2016 Elsevier Inc.  

## 2016-07-11 ENCOUNTER — Other Ambulatory Visit: Payer: Self-pay | Admitting: General Surgery

## 2016-07-11 DIAGNOSIS — Z1211 Encounter for screening for malignant neoplasm of colon: Secondary | ICD-10-CM | POA: Insufficient documentation

## 2016-07-11 DIAGNOSIS — Z8719 Personal history of other diseases of the digestive system: Secondary | ICD-10-CM

## 2016-08-04 ENCOUNTER — Telehealth: Payer: Self-pay | Admitting: General Surgery

## 2016-08-04 NOTE — Telephone Encounter (Signed)
PT CALLED & WOULD LIKE TO KNOW THE RESULTS OF HER LAB WORK & 24HR URNE DONE A COUPLE OF WEEKS AGO.SHE STATED WE CAN LEAVE A MESSAGE ON HER PHONE WITH THE RESULTS/MTH

## 2016-08-08 NOTE — Telephone Encounter (Signed)
No results as of yet. She should call if she has not heard anything by August 21.

## 2016-08-09 ENCOUNTER — Telehealth: Payer: Self-pay | Admitting: *Deleted

## 2016-08-09 NOTE — Telephone Encounter (Signed)
Patient called and was checking to see if the results were in yet. I told her they was not in our system that yesterday when she called about them the telephone encouner said to call back on August 21st if she hasn't heard anything.

## 2016-08-09 NOTE — Telephone Encounter (Signed)
Results from recent catecholamine testing dated 07/21/2016, reported 07/27/2016 and delivered to the requesting physician 08/09/2016 have been reviewed. No evidence of functional adrenocortical lesion.  The patient may proceed with plans for a screening colonoscopy.

## 2016-08-09 NOTE — Telephone Encounter (Signed)
I talked with the patient and the labs where done in [place]. I called and they are faxing [place] the results.

## 2016-08-10 NOTE — Telephone Encounter (Signed)
-----   Message from Robert Bellow, MD sent at 08/09/2016  4:30 PM EDT ----- Please notify the patient that her laboratory testing does not show any evidence of functional activity of the adenoma identified in her adrenal gland. She may proceed with plans for colonoscopy at her convenience.

## 2016-08-10 NOTE — Telephone Encounter (Signed)
Notified patient as instructed, patient pleased. Discussed follow-up appointments, patient agrees  

## 2016-08-15 ENCOUNTER — Encounter: Payer: Self-pay | Admitting: General Surgery

## 2016-08-25 ENCOUNTER — Telehealth: Payer: Self-pay

## 2016-08-25 NOTE — Telephone Encounter (Signed)
Patient called to cancel her colonoscopy for now. She wants to see her primary care doctor about a couple of issues first. She will call us back to reschedule this.

## 2016-08-30 ENCOUNTER — Ambulatory Visit: Admission: RE | Admit: 2016-08-30 | Payer: Medicare Other | Source: Ambulatory Visit | Admitting: General Surgery

## 2016-08-30 ENCOUNTER — Encounter: Admission: RE | Payer: Self-pay | Source: Ambulatory Visit

## 2016-08-30 SURGERY — COLONOSCOPY WITH PROPOFOL
Anesthesia: General

## 2017-08-16 ENCOUNTER — Ambulatory Visit (INDEPENDENT_AMBULATORY_CARE_PROVIDER_SITE_OTHER): Payer: Medicare Other | Admitting: Sports Medicine

## 2017-08-16 VITALS — BP 126/82 | Ht 66.0 in | Wt 195.0 lb

## 2017-08-16 DIAGNOSIS — M19079 Primary osteoarthritis, unspecified ankle and foot: Secondary | ICD-10-CM

## 2017-08-16 DIAGNOSIS — M2142 Flat foot [pes planus] (acquired), left foot: Secondary | ICD-10-CM | POA: Diagnosis not present

## 2017-08-16 DIAGNOSIS — M2140 Flat foot [pes planus] (acquired), unspecified foot: Secondary | ICD-10-CM | POA: Insufficient documentation

## 2017-08-16 DIAGNOSIS — M2141 Flat foot [pes planus] (acquired), right foot: Secondary | ICD-10-CM | POA: Diagnosis not present

## 2017-08-16 NOTE — Progress Notes (Signed)
   HPI  CC: Bilateral foot pain, recent right foot injury Patient is a new patient to our practice. She was referred to [place] for custom orthotics. She states that she has significant discomfort over her first MTP bilaterally. This was recently exacerbated 3 weeks ago and she had a fall. During that same episode she had some acute right sided lateral foot pain on the dorsal aspect. This has improved dramatically since the injury but continues to provide some discomfort throughout her days. Pain in her first MTP joint is achy in nature. The right-sided lateral foot pain, acute, is sharper and more tender to palpation. She denies any numbness, weakness, or paresthesias. No recent swelling, erythema, or ecchymosis. Pain is worse with ambulation and better with rest.  Medications/Interventions Tried: NSAIDs  See HPI and/or previous note for associated ROS.  Objective: BP 126/82   Ht 5\' 6"  (1.676 m)   Wt 195 lb (88.5 kg)   BMI 31.47 kg/m  Gen: NAD, well groomed, a/o x3, normal affect.  CV: Well-perfused. Warm.  Resp: Non-labored.  Neuro: Sensation intact throughout. No gross coordination deficits.  Gait: Nonpathologic posture, unremarkable stride without signs of limp or balance issues. Ankle/Foot, bilateral: TTP noted at the 1st MTP (bilat), and Rt dorsal surface of the 3-4th metatarsals. No visible erythema, swelling, ecchymosis, or bony deformity. Mild/Mod pes planus deformity. Transverse arch collapse present; mild evidence of tibiotalar deviation bilaterally; Range of motion is full in all directions. Strength is 5/5 in all directions. No peroneal tendon tenderness or subluxation; No tenderness on posterior aspects of lateral and medial malleolus; Stable lateral and medial ligaments; Talar dome nontender; No plantar calcaneal tenderness; No pain at base of 5th MT; No tenderness at the distal metatarsals; Able to walk 4 steps.    Custom Orthotics: Patient was fitted for a standard, cushioned,  semi-rigid orthotic. The orthotic was heated and afterward the patient stood on the orthotic blank positioned on the orthotic stand. The patient was positioned in subtalar neutral position and 10 degrees of ankle dorsiflexion in a weight bearing stance. After completion of molding, a stable base was applied to the orthotic blank. The blank was ground to a stable position for weight bearing. Size: W9 Base: [employer] and Padding: Initially a 1st MTP post was placed, this was later removed due to discomfort. The patient ambulated in these, and they were very comfortable.   Assessment and plan:  Flat foot Patient is here for custom orthotics due to her symptomatic pes planus, bilaterally. Orthotics made for patient today. Initial orthotic was made with a first ray post. Patient stated that this causes some discomfort. Post was then removed and patient endorsed significant improvement in her overall comfort level. - Orthotics made for patient today. - Patient sent home with metatarsal pad and instructions to add if any discomfort over her distal metatarsal heads was experienced (patient requested this) - Patient to follow-up as needed  Arthritis of first MTP joint See plan above   Meds ordered this encounter  Medications  . meloxicam (MOBIC) 7.5 MG tablet    Sig: Take 7.5 mg by mouth daily.     Elberta Leatherwood, MD,MS [facility] 08/16/2017 12:27 PM  I spent >30 minutes with this patient. 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

## 2017-08-16 NOTE — Assessment & Plan Note (Signed)
See plan above.

## 2017-08-16 NOTE — Assessment & Plan Note (Signed)
Patient is here for custom orthotics due to her symptomatic pes planus, bilaterally. Orthotics made for patient today. Initial orthotic was made with a first ray post. Patient stated that this causes some discomfort. Post was then removed and patient endorsed significant improvement in her overall comfort level. - Orthotics made for patient today. - Patient sent home with metatarsal pad and instructions to add if any discomfort over her distal metatarsal heads was experienced (patient requested this) - Patient to follow-up as needed

## 2017-11-20 ENCOUNTER — Ambulatory Visit
Admission: RE | Admit: 2017-11-20 | Discharge: 2017-11-20 | Disposition: A | Discharge status: Home / Self Care | Payer: Medicare Other | Source: Ambulatory Visit | Attending: Internal Medicine | Admitting: Internal Medicine

## 2017-11-20 ENCOUNTER — Other Ambulatory Visit: Payer: Self-pay | Admitting: Internal Medicine

## 2017-11-20 DIAGNOSIS — M4802 Spinal stenosis, cervical region: Secondary | ICD-10-CM | POA: Diagnosis not present

## 2017-11-20 DIAGNOSIS — M542 Cervicalgia: Secondary | ICD-10-CM

## 2017-11-20 DIAGNOSIS — M5031 Other cervical disc degeneration,  high cervical region: Secondary | ICD-10-CM | POA: Diagnosis not present

## 2018-04-02 ENCOUNTER — Emergency Department (HOSPITAL_COMMUNITY)
Admission: EM | Admit: 2018-04-02 | Discharge: 2018-04-03 | Disposition: A | Discharge status: Home / Self Care | Payer: Medicare Other | Attending: Emergency Medicine | Admitting: Emergency Medicine

## 2018-04-02 ENCOUNTER — Encounter (HOSPITAL_COMMUNITY): Payer: Self-pay | Admitting: Emergency Medicine

## 2018-04-02 DIAGNOSIS — Z79899 Other long term (current) drug therapy: Secondary | ICD-10-CM | POA: Insufficient documentation

## 2018-04-02 DIAGNOSIS — Y999 Unspecified external cause status: Secondary | ICD-10-CM | POA: Insufficient documentation

## 2018-04-02 DIAGNOSIS — S42292A Other displaced fracture of upper end of left humerus, initial encounter for closed fracture: Secondary | ICD-10-CM | POA: Insufficient documentation

## 2018-04-02 DIAGNOSIS — M25562 Pain in left knee: Secondary | ICD-10-CM | POA: Diagnosis not present

## 2018-04-02 DIAGNOSIS — M25561 Pain in right knee: Secondary | ICD-10-CM | POA: Diagnosis not present

## 2018-04-02 DIAGNOSIS — Y9301 Activity, walking, marching and hiking: Secondary | ICD-10-CM | POA: Diagnosis not present

## 2018-04-02 DIAGNOSIS — Y92009 Unspecified place in unspecified non-institutional (private) residence as the place of occurrence of the external cause: Secondary | ICD-10-CM | POA: Diagnosis not present

## 2018-04-02 DIAGNOSIS — M25532 Pain in left wrist: Secondary | ICD-10-CM | POA: Insufficient documentation

## 2018-04-02 DIAGNOSIS — J45909 Unspecified asthma, uncomplicated: Secondary | ICD-10-CM | POA: Diagnosis not present

## 2018-04-02 DIAGNOSIS — Z87891 Personal history of nicotine dependence: Secondary | ICD-10-CM | POA: Diagnosis not present

## 2018-04-02 DIAGNOSIS — W010XXA Fall on same level from slipping, tripping and stumbling without subsequent striking against object, initial encounter: Secondary | ICD-10-CM | POA: Insufficient documentation

## 2018-04-02 DIAGNOSIS — M25522 Pain in left elbow: Secondary | ICD-10-CM | POA: Insufficient documentation

## 2018-04-02 DIAGNOSIS — I10 Essential (primary) hypertension: Secondary | ICD-10-CM | POA: Diagnosis not present

## 2018-04-02 DIAGNOSIS — S4982XA Other specified injuries of left shoulder and upper arm, initial encounter: Secondary | ICD-10-CM | POA: Diagnosis present

## 2018-04-02 HISTORY — DX: Unspecified osteoarthritis, unspecified site: M19.90

## 2018-04-02 NOTE — ED Triage Notes (Signed)
Patient arrived with EMS from home , tripped and fell at home this evening reports left shoulder joint pain with mild swelling , denies LOC/ambulatory. She received Morphine 10 mg IV by EMS prior to arrival with relief .

## 2018-04-03 ENCOUNTER — Emergency Department (HOSPITAL_COMMUNITY): Payer: Medicare Other

## 2018-04-03 DIAGNOSIS — S42292A Other displaced fracture of upper end of left humerus, initial encounter for closed fracture: Secondary | ICD-10-CM | POA: Diagnosis not present

## 2018-04-03 MED ORDER — HYDROCODONE-ACETAMINOPHEN 5-325 MG PO TABS: 0.5000 | ORAL_TABLET | ORAL | 0 refills | Status: DC | PRN

## 2018-04-03 NOTE — ED Notes (Signed)
Patient transported to X-ray 

## 2018-04-03 NOTE — ED Provider Notes (Signed)
[facility] Provider Note   CSN: [id number] Arrival date & time: 04/02/18  2344     History   Chief Complaint Chief Complaint  Patient presents with  . Fall    Left Shoulder Injury    HPI Barbara Savage is a 77 y.o. female.  Patient presents to the ER for evaluation after a fall.  Patient reports that she was walking in her home and tripped over a doorway threshold.  She fell forward and landed on her hands and knees.  She is complaining primarily of left shoulder pain but also her knees are hurting.  She did not hit her head or lose consciousness.  She denies neck pain.  She has not on a blood thinner.  Pain in the shoulder is constant and worsens with any movement.     Past Medical History:  Diagnosis Date  . Arthritis   . Asthma   . Celiac disease   . Diverticulosis   . Sciatica     Patient Active Problem List   Diagnosis Date Noted  . Flat foot 08/16/2017  . Arthritis of first MTP joint 08/16/2017  . Encounter for screening colonoscopy 07/11/2016  . Acute diverticulitis 06/09/2016  . Essential hypertension 01/14/2016  . Dyspnea 01/13/2016  . Piriformis syndrome of right side 01/04/2016  . Moderate persistent chronic asthma without complication [id number]/[id number]    Past Surgical History:  Procedure Laterality Date  . TONSILLECTOMY       OB History   None      Home Medications    Prior to Admission medications   Medication Sig Start Date End Date Taking? Authorizing Provider  B Complex Vitamins (B COMPLEX 50 PO) Take 1 tablet by mouth daily. Reported on 03/14/2016    [provider]  Cholecalciferol (VITAMIN D3) 2000 units TABS Take 1 tablet by mouth daily.    [provider]  HYDROcodone-acetaminophen (NORCO/VICODIN) 5-325 MG tablet Take 0.5-1 tablets by mouth every 4 (four) hours as needed for moderate pain. 04/03/18   Orpah Greek, MD  ibuprofen (ADVIL,MOTRIN) 100 MG tablet Take 100 mg by  mouth 3 (three) times daily.    [provider]  Lutein 20 MG CAPS Take 1 capsule by mouth daily. Reported on 03/14/2016    [provider]  Magnesium 200 MG TABS Take 1 tablet by mouth daily. Reported on 03/14/2016    [provider]  meloxicam (MOBIC) 7.5 MG tablet Take 7.5 mg by mouth daily.    [provider]  Misc Natural Products (GLUCOSAMINE CHONDROITIN MSM PO) Take 1 tablet by mouth daily.    [provider]  Misc Natural Products (TART CHERRY ADVANCED) CAPS Take 1 capsule by mouth daily.    [provider]  Multiple Vitamin (MULTIVITAMIN WITH MINERALS) TABS tablet Take 1 tablet by mouth daily.    [provider]  polyethylene glycol powder (GLYCOLAX/MIRALAX) powder 255 grams one bottle for colonoscopy prep 07/10/16   Robert Bellow, MD  PROAIR HFA 108 [phone] Base) MCG/ACT inhaler Inhale 2 puffs into the lungs every 6 (six) hours as needed for wheezing or shortness of breath.  12/07/15   [provider]  Quercetin 250 MG TABS Take 1 tablet by mouth daily. For allergy health    [provider]  TURMERIC PO Take 1 capsule by mouth daily. With black pepper    [provider]    Family History Family History  Problem Relation Age of Onset  .  Colon cancer Paternal Grandmother 19  . Kidney cancer Maternal Grandmother 80    Social History Social History   Tobacco Use  . Smoking status: Former Smoker    Packs/day: 2.50    Years: 10.00    Pack years: 25.00    Types: Cigarettes    Last attempt to quit: 12/25/1978    Years since quitting: 39.2  . Smokeless tobacco: Never Used  Substance Use Topics  . Alcohol use: No    Alcohol/week: 0.0 oz  . Drug use: No     Allergies   Breo ellipta [fluticasone furoate-vilanterol] and Penicillins   Review of Systems Review of Systems  Musculoskeletal: Positive for arthralgias.  All other systems reviewed and are negative.    Physical Exam Updated  Vital Signs BP (!) 148/79 (BP Location: Right Arm)   Pulse 83   Temp 98.1 F (36.7 C) (Oral)   Resp 18   Ht 5\' 6"  (1.676 m)   Wt 88.5 kg (195 lb)   SpO2 95%   BMI 31.47 kg/m   Physical Exam  Constitutional: She is oriented to person, place, and time. She appears well-developed and well-nourished. No distress.  HENT:  Head: Normocephalic and atraumatic.  Right Ear: Hearing normal.  Left Ear: Hearing normal.  Nose: Nose normal.  Mouth/Throat: Oropharynx is clear and moist and mucous membranes are normal.  Eyes: Pupils are equal, round, and reactive to light. Conjunctivae and EOM are normal.  Neck: Normal range of motion. Neck supple. No spinous process tenderness present.  Cardiovascular: Regular rhythm, S1 normal and S2 normal. Exam reveals no gallop and no friction rub.  No murmur heard. Pulmonary/Chest: Effort normal and breath sounds normal. No respiratory distress. She exhibits no tenderness.  Abdominal: Soft. Normal appearance and bowel sounds are normal. There is no hepatosplenomegaly. There is no tenderness. There is no rebound, no guarding, no tenderness at McBurney's point and negative Murphy's sign. No hernia.  Musculoskeletal:       Left shoulder: She exhibits decreased range of motion and tenderness. She exhibits no deformity.       Left elbow: She exhibits swelling. She exhibits no deformity. Tenderness found.       Left wrist: She exhibits tenderness. She exhibits no swelling and no deformity.       Right knee: She exhibits normal range of motion, no swelling and no deformity. Tenderness found.       Left knee: She exhibits normal range of motion, no swelling and no deformity. Tenderness found.  Neurological: She is alert and oriented to person, place, and time. She has normal strength. No cranial nerve deficit or sensory deficit. Coordination normal. GCS eye subscore is 4. GCS verbal subscore is 5. GCS motor subscore is 6.  Skin: Skin is warm, dry and intact. No rash  noted. No cyanosis.  Psychiatric: She has a normal mood and affect. Her speech is normal and behavior is normal. Thought content normal.  Nursing note and vitals reviewed.    ED Treatments / Results  Labs (all labs ordered are listed, but only abnormal results are displayed) Labs Reviewed - No data to display  EKG None  Radiology Dg Elbow Complete Left  Result Date: 04/03/2018 CLINICAL DATA:  Initial evaluation for acute trauma, fall. EXAM: LEFT ELBOW - COMPLETE 3+ VIEW COMPARISON:  None. FINDINGS: No acute fracture or dislocation. No joint effusion. Radial head intact. Scattered osteoarthritic changes noted about the elbow. No acute soft tissue abnormality. Diffuse osteopenia noted. IMPRESSION: No acute  osseous abnormality about the left elbow. Electronically Signed   By: Jeannine Boga M.D.   On: 04/03/2018 01:10   Dg Wrist Complete Left  Result Date: 04/03/2018 CLINICAL DATA:  Initial evaluation for acute trauma, fall. EXAM: LEFT WRIST - COMPLETE 3+ VIEW COMPARISON:  None. FINDINGS: No acute fracture or dislocation. Normal radiocarpal and distal radioulnar articulations intact. Prominent degenerative changes noted about the left first [facility] joint. No acute soft tissue abnormality. Diffuse osteopenia noted. IMPRESSION: No acute osseous abnormality about the left wrist. Electronically Signed   By: Jeannine Boga M.D.   On: 04/03/2018 01:18   Dg Shoulder Left  Result Date: 04/03/2018 CLINICAL DATA:  Initial evaluation for acute trauma, fall. EXAM: LEFT SHOULDER - 2+ VIEW COMPARISON:  None. FINDINGS: Acute comminuted fracture involving the left humeral neck with superior extension into the left humeral head and greater tuberosity is seen. Humeral head somewhat subluxed inferiorly relative to the glenoid. AC joint approximated. No acute soft tissue abnormality. Bones are diffusely osteopenic. IMPRESSION: Acute comminuted mildly displaced fracture of the left humeral head and neck.  Electronically Signed   By: Jeannine Boga M.D.   On: 04/03/2018 01:08   Dg Knee Complete 4 Views Left  Result Date: 04/03/2018 CLINICAL DATA:  Initial evaluation for acute trauma, fall. EXAM: LEFT KNEE - COMPLETE 4+ VIEW COMPARISON:  None. FINDINGS: No acute fracture or dislocation. No joint effusion. Moderate tricompartmental degenerative osteoarthrosis. No acute soft tissue abnormality. Diffuse osteopenia. IMPRESSION: 1. No acute osseous abnormality about the left knee. 2. Moderate tricompartmental degenerative osteoarthrosis. Electronically Signed   By: Jeannine Boga M.D.   On: 04/03/2018 01:13   Dg Knee Complete 4 Views Right  Result Date: 04/03/2018 CLINICAL DATA:  Initial evaluation for acute trauma, fall. EXAM: RIGHT KNEE - COMPLETE 4+ VIEW COMPARISON:  None. FINDINGS: No acute fracture dislocation. No joint effusion. Moderate tricompartmental degenerative osteoarthrosis. No soft tissue abnormality. Diffuse osteopenia. IMPRESSION: 1. No acute osseous abnormality about the right knee. 2. Moderate tricompartmental degenerative osteoarthrosis. Electronically Signed   By: Jeannine Boga M.D.   On: 04/03/2018 01:16    Procedures Procedures (including critical care time)  Medications Ordered in ED Medications - No data to display   Initial Impression / Assessment and Plan / ED Course  I have reviewed the triage vital signs and the nursing notes.  Pertinent labs & imaging results that were available during my care of the patient were reviewed by me and considered in my medical decision making (see chart for details).     Patient presents to the ER for evaluation of left shoulder pain after a ground-level fall.  Patient reports that she tripped, falling forward.  She landed on her hands and knees.  There was no head injury.  She does not have any neck or spinal pain.  Patient complaining of bilateral knee pain, left arm pain, specifically shoulder but elbow and wrist were  tender to palpation as well.  X-ray of bilateral knees, left wrist, left elbow unremarkable.  X-ray of left shoulder does reveal proximal humerus fracture.  Patient will be maintained in a sling, provided analgesia.  She is to follow-up with orthopedics (she has an established orthopedic surgeon).  Final Clinical Impressions(s) / ED Diagnoses   Final diagnoses:  Other closed displaced fracture of proximal end of left humerus, initial encounter    ED Discharge Orders        Ordered    HYDROcodone-acetaminophen (NORCO/VICODIN) 5-325 MG tablet  Every 4 hours PRN  04/03/18 0146       Orpah Greek, MD 04/03/18 [phone]

## 2018-04-04 ENCOUNTER — Other Ambulatory Visit: Payer: Self-pay | Admitting: Orthopedic Surgery

## 2018-04-04 DIAGNOSIS — R52 Pain, unspecified: Secondary | ICD-10-CM

## 2018-04-04 DIAGNOSIS — T148XXA Other injury of unspecified body region, initial encounter: Secondary | ICD-10-CM

## 2018-04-05 ENCOUNTER — Ambulatory Visit
Admission: RE | Admit: 2018-04-05 | Discharge: 2018-04-05 | Disposition: A | Discharge status: Home / Self Care | Payer: Medicare Other | Source: Ambulatory Visit | Attending: Orthopedic Surgery | Admitting: Orthopedic Surgery

## 2018-04-05 DIAGNOSIS — R52 Pain, unspecified: Secondary | ICD-10-CM

## 2018-04-05 DIAGNOSIS — T148XXA Other injury of unspecified body region, initial encounter: Secondary | ICD-10-CM

## 2018-04-09 ENCOUNTER — Encounter (HOSPITAL_COMMUNITY): Payer: Self-pay | Admitting: *Deleted

## 2018-04-09 ENCOUNTER — Other Ambulatory Visit: Payer: Self-pay

## 2018-04-09 NOTE — Progress Notes (Signed)
Mrs Rivenbark reports that after she had taken a few doses of Hydrocodone- Acetaminophen, patient experienced swelling of arms, legs, feet, chest and decreased urinary output.  Patient said after she stopped taking Hydrocodone- Acetaminophen that the swelling went down and urine output increased. I informed Dr. Therisa Doyne of this reaction, no new orders were given.

## 2018-04-09 NOTE — H&P (Signed)
PREOPERATIVE H&P  Chief Complaint: left proximal humerus fracture  HPI: Barbara Savage is a 77 y.o. female who presents for preoperative history and physical with a diagnosis of left proximal humerus fracture. Symptoms are rated as moderate to severe, and have been worsening.  This is significantly impairing activities of daily living.  She has elected for surgical management.   Past Medical History:  Diagnosis Date  . Arthritis   . Asthma   . Celiac disease   . Diverticulosis   . Sciatica    Past Surgical History:  Procedure Laterality Date  . TONSILLECTOMY     Social History   Socioeconomic History  . Marital status: Married    Spouse name: Not on file  . Number of children: Not on file  . Years of education: Not on file  . Highest education level: Not on file  Occupational History  . Occupation: Engineer, maintenance (IT)   Social Needs  . Financial resource strain: Not on file  . Food insecurity:    Worry: Not on file    Inability: Not on file  . Transportation needs:    Medical: Not on file    Non-medical: Not on file  Tobacco Use  . Smoking status: Former Smoker    Packs/day: 2.50    Years: 10.00    Pack years: 25.00    Types: Cigarettes    Last attempt to quit: 12/25/1978    Years since quitting: 39.3  . Smokeless tobacco: Never Used  Substance and Sexual Activity  . Alcohol use: No    Alcohol/week: 0.0 oz  . Drug use: No  . Sexual activity: Not on file  Lifestyle  . Physical activity:    Days per week: Not on file    Minutes per session: Not on file  . Stress: Not on file  Relationships  . Social connections:    Talks on phone: Not on file    Gets together: Not on file    Attends religious service: Not on file    Active member of club or organization: Not on file    Attends meetings of clubs or organizations: Not on file    Relationship status: Not on file  Other Topics Concern  . Not on file  Social History Narrative  . Not on file   Family History  Problem  Relation Age of Onset  . Colon cancer Paternal Grandmother 3  . Kidney cancer Maternal Grandmother 80   Allergies  Allergen Reactions  . Breo Ellipta [Fluticasone Furoate-Vilanterol]     Worsens asthmatic symptoms  . Penicillins Rash    Has patient had a PCN reaction causing immediate rash, facial/tongue/throat swelling, SOB or lightheadedness with hypotension: Yes Has patient had a PCN reaction causing severe rash involving mucus membranes or skin necrosis: No Has patient had a PCN reaction that required hospitalization No Has patient had a PCN reaction occurring within the last 10 years: No If all of the above answers are "NO", then may proceed with Cephalosporin use.    Prior to Admission medications   Medication Sig Start Date End Date Taking? Authorizing Provider  B Complex Vitamins (B COMPLEX 50 PO) Take 1 tablet by mouth daily. Reported on 03/14/2016   Yes [provider]  celecoxib (CELEBREX) 200 MG capsule Take 200 mg by mouth daily.   Yes [provider]  Cholecalciferol (VITAMIN D3) 2000 units TABS Take 1 tablet by mouth daily.   Yes [provider]  HYDROcodone-acetaminophen (NORCO/VICODIN) 5-325 MG tablet  Take 0.5-1 tablets by mouth every 4 (four) hours as needed for moderate pain. 04/03/18  Yes Pollina, Gwenyth Allegra, MD  Lutein 20 MG CAPS Take 1 capsule by mouth daily. Reported on 03/14/2016   Yes [provider]  Misc Natural Products ([place] MSM PO) Take 1 tablet by mouth daily.   Yes [provider]  Misc Natural Products (TART CHERRY ADVANCED) CAPS Take 1 capsule by mouth daily.   Yes [provider]  Multiple Vitamin (MULTIVITAMIN WITH MINERALS) TABS tablet Take 1 tablet by mouth daily.   Yes [provider]  PROAIR HFA 108 (90 Base) MCG/ACT inhaler Inhale 2 puffs into the lungs every 6 (six) hours as needed for wheezing or shortness of breath.  12/07/15  Yes [provider]   Quercetin 250 MG TABS Take 1 tablet by mouth daily. For allergy health   Yes [provider]  TURMERIC PO Take 1 capsule by mouth daily. With black pepper   Yes [provider]  polyethylene glycol powder (GLYCOLAX/MIRALAX) powder 255 grams one bottle for colonoscopy prep Patient not taking: Reported on 04/08/2018 07/10/16   Robert Bellow, MD     Positive ROS: All other systems have been reviewed and were otherwise negative with the exception of those mentioned in the HPI and as above.  Physical Exam: General: Alert, no acute distress Cardiovascular: No pedal edema Respiratory: No cyanosis, no use of accessory musculature GI: No organomegaly, abdomen is soft and non-tender Skin: No lesions in the area of chief complaint Neurologic: Sensation intact distally Psychiatric: Patient is competent for consent with normal mood and affect Lymphatic: No axillary or cervical lymphadenopathy  MUSCULOSKELETAL: L shoulder: +axillary nerve function, scattered ecchmyosis, NVID  Assessment: left proximal humerus fracture  Plan: Plan for Procedure(s): LEFT TOTAL SHOULDER ARTHROPLASTY  The risks benefits and alternatives were discussed with the patient including but not limited to the risks of nonoperative treatment, versus surgical intervention including infection, bleeding, nerve injury,  blood clots, cardiopulmonary complications, morbidity, mortality, among others, and they were willing to proceed.   Hiram Gash, MD  04/09/2018 4:06 PM

## 2018-04-10 ENCOUNTER — Inpatient Hospital Stay (HOSPITAL_COMMUNITY)
Admission: RE | Admit: 2018-04-10 | Discharge: 2018-04-11 | DRG: 483 | Disposition: A | Discharge status: Home / Self Care | Payer: Medicare Other | Source: Ambulatory Visit | Attending: Orthopaedic Surgery | Admitting: Orthopaedic Surgery

## 2018-04-10 ENCOUNTER — Inpatient Hospital Stay (HOSPITAL_COMMUNITY): Payer: Medicare Other | Admitting: Anesthesiology

## 2018-04-10 ENCOUNTER — Encounter (HOSPITAL_COMMUNITY): Payer: Self-pay | Admitting: *Deleted

## 2018-04-10 ENCOUNTER — Inpatient Hospital Stay (HOSPITAL_COMMUNITY): Payer: Medicare Other

## 2018-04-10 ENCOUNTER — Encounter (HOSPITAL_COMMUNITY)
Admission: RE | Disposition: A | Discharge status: Home / Self Care | Payer: Self-pay | Source: Ambulatory Visit | Attending: Orthopaedic Surgery

## 2018-04-10 DIAGNOSIS — W19XXXA Unspecified fall, initial encounter: Secondary | ICD-10-CM | POA: Diagnosis present

## 2018-04-10 DIAGNOSIS — Z419 Encounter for procedure for purposes other than remedying health state, unspecified: Secondary | ICD-10-CM

## 2018-04-10 DIAGNOSIS — J45909 Unspecified asthma, uncomplicated: Secondary | ICD-10-CM | POA: Diagnosis present

## 2018-04-10 DIAGNOSIS — S42232A 3-part fracture of surgical neck of left humerus, initial encounter for closed fracture: Secondary | ICD-10-CM | POA: Diagnosis not present

## 2018-04-10 DIAGNOSIS — Z87891 Personal history of nicotine dependence: Secondary | ICD-10-CM

## 2018-04-10 DIAGNOSIS — S42202A Unspecified fracture of upper end of left humerus, initial encounter for closed fracture: Secondary | ICD-10-CM | POA: Diagnosis present

## 2018-04-10 DIAGNOSIS — Z880 Allergy status to penicillin: Secondary | ICD-10-CM

## 2018-04-10 DIAGNOSIS — Z885 Allergy status to narcotic agent status: Secondary | ICD-10-CM | POA: Diagnosis not present

## 2018-04-10 DIAGNOSIS — M659 Synovitis and tenosynovitis, unspecified: Secondary | ICD-10-CM | POA: Diagnosis present

## 2018-04-10 DIAGNOSIS — Z09 Encounter for follow-up examination after completed treatment for conditions other than malignant neoplasm: Secondary | ICD-10-CM

## 2018-04-10 HISTORY — DX: Family history of other specified conditions: Z84.89

## 2018-04-10 HISTORY — DX: Cardiac arrhythmia, unspecified: I49.9

## 2018-04-10 HISTORY — PX: TOTAL SHOULDER ARTHROPLASTY: SHX126

## 2018-04-10 HISTORY — DX: Malignant (primary) neoplasm, unspecified: C80.1

## 2018-04-10 HISTORY — DX: Pneumonia, unspecified organism: J18.9

## 2018-04-10 LAB — BASIC METABOLIC PANEL
Anion gap: 10 (ref 5–15)
BUN: 8 mg/dL (ref 6–20)
CALCIUM: 9.5 mg/dL (ref 8.9–10.3)
CO2: 24 mmol/L (ref 22–32)
CREATININE: 0.65 mg/dL (ref 0.44–1.00)
Chloride: 104 mmol/L (ref 101–111)
GFR calc non Af Amer: >60 mL/min (ref >60)
GLUCOSE: 105 mg/dL — AB (ref 65–99)
Potassium: 4 mmol/L (ref 3.5–5.1)
Sodium: 138 mmol/L (ref 135–145)

## 2018-04-10 LAB — CBC
HCT: 41 % (ref 36.0–46.0)
Hemoglobin: 13.4 g/dL (ref 12.0–15.0)
MCH: 30.6 pg (ref 26.0–34.0)
MCHC: 32.7 g/dL (ref 30.0–36.0)
MCV: 93.6 fL (ref 78.0–100.0)
PLATELETS: 348 10*3/uL (ref 150–400)
RBC: 4.38 MIL/uL (ref 3.87–5.11)
RDW: 13.5 % (ref 11.5–15.5)
WBC: 10.6 10*3/uL — ABNORMAL HIGH (ref 4.0–10.5)

## 2018-04-10 SURGERY — ARTHROPLASTY, SHOULDER, TOTAL
Anesthesia: General | Site: Shoulder | Laterality: Left

## 2018-04-10 MED ORDER — OXYCODONE HCL 5 MG PO TABS
5.0000 mg | ORAL_TABLET | ORAL | Status: DC | PRN
  Administered 2018-04-10 – 2018-04-11 (×2): 10 mg via ORAL
  Filled 2018-04-10 (×3): qty 2

## 2018-04-10 MED ORDER — FENTANYL CITRATE (PF) 100 MCG/2ML IJ SOLN: 25.0000 ug | INTRAMUSCULAR | Status: DC | PRN

## 2018-04-10 MED ORDER — ROCURONIUM BROMIDE 100 MG/10ML IV SOLN
INTRAVENOUS | Status: DC | PRN
  Administered 2018-04-10: 30 mg via INTRAVENOUS
  Administered 2018-04-10: 40 mg via INTRAVENOUS

## 2018-04-10 MED ORDER — CHLORHEXIDINE GLUCONATE 4 % EX LIQD: 60.0000 mL | Freq: Once | CUTANEOUS | Status: DC

## 2018-04-10 MED ORDER — CEFAZOLIN SODIUM-DEXTROSE 2-4 GM/100ML-% IV SOLN
2.0000 g | INTRAVENOUS | Status: AC
  Administered 2018-04-10: 2 g via INTRAVENOUS

## 2018-04-10 MED ORDER — MIDAZOLAM HCL 2 MG/2ML IJ SOLN
INTRAMUSCULAR | Status: AC
  Administered 2018-04-10: 1 mg
  Filled 2018-04-10: qty 2

## 2018-04-10 MED ORDER — CEFAZOLIN SODIUM-DEXTROSE 2-4 GM/100ML-% IV SOLN
INTRAVENOUS | Status: AC
  Filled 2018-04-10: qty 100

## 2018-04-10 MED ORDER — LACTATED RINGERS IV SOLN
INTRAVENOUS | Status: DC
  Administered 2018-04-10 (×2): via INTRAVENOUS

## 2018-04-10 MED ORDER — 0.9 % SODIUM CHLORIDE (POUR BTL) OPTIME
TOPICAL | Status: DC | PRN
  Administered 2018-04-10: 1000 mL

## 2018-04-10 MED ORDER — HYDROMORPHONE HCL 2 MG/ML IJ SOLN: 0.5000 mg | INTRAMUSCULAR | Status: DC | PRN

## 2018-04-10 MED ORDER — BUPIVACAINE HCL (PF) 0.5 % IJ SOLN
INTRAMUSCULAR | Status: DC | PRN
  Administered 2018-04-10: 10 mL via PERINEURAL

## 2018-04-10 MED ORDER — MEPERIDINE HCL 50 MG/ML IJ SOLN: 6.2500 mg | INTRAMUSCULAR | Status: DC | PRN

## 2018-04-10 MED ORDER — ACETAMINOPHEN 500 MG PO TABS
1000.0000 mg | ORAL_TABLET | Freq: Four times a day (QID) | ORAL | Status: DC
  Administered 2018-04-10 – 2018-04-11 (×3): 1000 mg via ORAL
  Filled 2018-04-10 (×3): qty 2

## 2018-04-10 MED ORDER — TRANEXAMIC ACID 1000 MG/10ML IV SOLN
1000.0000 mg | INTRAVENOUS | Status: AC
  Administered 2018-04-10: 1000 mg via INTRAVENOUS
  Filled 2018-04-10: qty 10

## 2018-04-10 MED ORDER — DEXAMETHASONE SODIUM PHOSPHATE 4 MG/ML IJ SOLN
INTRAMUSCULAR | Status: DC | PRN
  Administered 2018-04-10: 10 mg via INTRAVENOUS

## 2018-04-10 MED ORDER — ONDANSETRON HCL 4 MG PO TABS: 4.0000 mg | ORAL_TABLET | Freq: Four times a day (QID) | ORAL | Status: DC | PRN

## 2018-04-10 MED ORDER — PROPOFOL 10 MG/ML IV BOLUS
INTRAVENOUS | Status: DC | PRN
  Administered 2018-04-10: 120 mg via INTRAVENOUS

## 2018-04-10 MED ORDER — PHENYLEPHRINE HCL 10 MG/ML IJ SOLN
INTRAMUSCULAR | Status: DC | PRN
  Administered 2018-04-10: 25 ug/min via INTRAVENOUS

## 2018-04-10 MED ORDER — BUPIVACAINE HCL (PF) 0.5 % IJ SOLN: INTRAMUSCULAR | Status: DC | PRN

## 2018-04-10 MED ORDER — FENTANYL CITRATE (PF) 250 MCG/5ML IJ SOLN
INTRAMUSCULAR | Status: AC
  Filled 2018-04-10: qty 5

## 2018-04-10 MED ORDER — LIDOCAINE HCL (CARDIAC) PF 100 MG/5ML IV SOSY
PREFILLED_SYRINGE | INTRAVENOUS | Status: DC | PRN
  Administered 2018-04-10: 60 mg via INTRAVENOUS

## 2018-04-10 MED ORDER — PROPOFOL 10 MG/ML IV BOLUS
INTRAVENOUS | Status: AC
  Filled 2018-04-10: qty 20

## 2018-04-10 MED ORDER — ONDANSETRON HCL 4 MG/2ML IJ SOLN
INTRAMUSCULAR | Status: DC | PRN
  Administered 2018-04-10: 4 mg via INTRAVENOUS

## 2018-04-10 MED ORDER — ONDANSETRON HCL 4 MG/2ML IJ SOLN: 4.0000 mg | Freq: Four times a day (QID) | INTRAMUSCULAR | Status: DC | PRN

## 2018-04-10 MED ORDER — EPHEDRINE SULFATE 50 MG/ML IJ SOLN
INTRAMUSCULAR | Status: DC | PRN
  Administered 2018-04-10: 10 mg via INTRAVENOUS
  Administered 2018-04-10: 15 mg via INTRAVENOUS

## 2018-04-10 MED ORDER — FENTANYL CITRATE (PF) 100 MCG/2ML IJ SOLN
INTRAMUSCULAR | Status: AC
  Administered 2018-04-10: 100 ug
  Filled 2018-04-10: qty 2

## 2018-04-10 MED ORDER — FENTANYL CITRATE (PF) 100 MCG/2ML IJ SOLN
INTRAMUSCULAR | Status: DC | PRN
  Administered 2018-04-10: 50 ug via INTRAVENOUS

## 2018-04-10 MED ORDER — CELECOXIB 200 MG PO CAPS
200.0000 mg | ORAL_CAPSULE | Freq: Two times a day (BID) | ORAL | Status: DC
  Administered 2018-04-10 – 2018-04-11 (×2): 200 mg via ORAL
  Filled 2018-04-10 (×2): qty 1

## 2018-04-10 MED ORDER — BUPIVACAINE LIPOSOME 1.3 % IJ SUSP
INTRAMUSCULAR | Status: DC | PRN
  Administered 2018-04-10: 10 mL via PERINEURAL

## 2018-04-10 MED ORDER — ALBUMIN HUMAN 5 % IV SOLN
INTRAVENOUS | Status: DC | PRN
  Administered 2018-04-10: 13:00:00 via INTRAVENOUS

## 2018-04-10 MED ORDER — ZOLPIDEM TARTRATE 5 MG PO TABS: 5.0000 mg | ORAL_TABLET | Freq: Every evening | ORAL | Status: DC | PRN

## 2018-04-10 MED ORDER — SUGAMMADEX SODIUM 200 MG/2ML IV SOLN
INTRAVENOUS | Status: DC | PRN
  Administered 2018-04-10: 200 mg via INTRAVENOUS

## 2018-04-10 MED ORDER — SODIUM CHLORIDE 0.9 % IR SOLN
Status: DC | PRN
  Administered 2018-04-10: 3000 mL

## 2018-04-10 MED ORDER — TRAMADOL HCL 50 MG PO TABS: 100.0000 mg | ORAL_TABLET | Freq: Four times a day (QID) | ORAL | Status: DC | PRN

## 2018-04-10 MED ORDER — DOCUSATE SODIUM 100 MG PO CAPS
100.0000 mg | ORAL_CAPSULE | Freq: Two times a day (BID) | ORAL | Status: DC
  Administered 2018-04-10 – 2018-04-11 (×2): 100 mg via ORAL
  Filled 2018-04-10 (×2): qty 1

## 2018-04-10 MED ORDER — DIPHENHYDRAMINE HCL 12.5 MG/5ML PO ELIX: 12.5000 mg | ORAL_SOLUTION | ORAL | Status: DC | PRN

## 2018-04-10 MED ORDER — CEFAZOLIN SODIUM-DEXTROSE 1-4 GM/50ML-% IV SOLN
1.0000 g | Freq: Four times a day (QID) | INTRAVENOUS | Status: AC
  Administered 2018-04-10 – 2018-04-11 (×3): 1 g via INTRAVENOUS
  Filled 2018-04-10 (×3): qty 50

## 2018-04-10 SURGICAL SUPPLY — 72 items
BASEPLATE GLENOSPHERE 25 STD (Miscellaneous) ×2 IMPLANT
BENZOIN TINCTURE PRP APPL 2/3 (GAUZE/BANDAGES/DRESSINGS) ×2 IMPLANT
BIT DRILL 3.2 PERIPHERAL SCREW (BIT) ×2 IMPLANT
BLADE SAW SAG 29X58X.64 (BLADE) IMPLANT
BLADE SAW SAG 73X25 THK (BLADE) ×1
BLADE SAW SGTL 73X25 THK (BLADE) ×1 IMPLANT
BODY PROXIMAL PTC 11 132.5D (Spacer) ×1 IMPLANT
CAP LOCKING COCR (Cap) ×2 IMPLANT
CHLORAPREP W/TINT 26ML (MISCELLANEOUS) ×4 IMPLANT
COVER SURGICAL LIGHT HANDLE (MISCELLANEOUS) ×2 IMPLANT
DRAPE C-ARM 42X72 X-RAY (DRAPES) IMPLANT
DRAPE INCISE IOBAN 66X45 STRL (DRAPES) ×4 IMPLANT
DRAPE ORTHO SPLIT 77X108 STRL (DRAPES) ×2
DRAPE PROXIMA HALF (DRAPES) ×2 IMPLANT
DRAPE SURG ORHT 6 SPLT 77X108 (DRAPES) ×2 IMPLANT
DRAPE SWITCH (DRAPES) ×2 IMPLANT
DRSG AQUACEL AG ADV 3.5X 6 (GAUZE/BANDAGES/DRESSINGS) ×2 IMPLANT
ELECT BLADE 4.0 EZ CLEAN MEGAD (MISCELLANEOUS) ×2
ELECT CAUTERY BLADE 6.4 (BLADE) ×2 IMPLANT
ELECT REM PT RETURN 9FT ADLT (ELECTROSURGICAL) ×2
ELECTRODE BLDE 4.0 EZ CLN MEGD (MISCELLANEOUS) ×1 IMPLANT
ELECTRODE REM PT RTRN 9FT ADLT (ELECTROSURGICAL) ×1 IMPLANT
GLENOSPHERE REV SHOULDER 36 (Joint) ×2 IMPLANT
GLOVE BIOGEL PI IND STRL 8 (GLOVE) ×1 IMPLANT
GLOVE BIOGEL PI INDICATOR 8 (GLOVE) ×1
GLOVE ECLIPSE 8.0 STRL XLNG CF (GLOVE) ×4 IMPLANT
GOWN STRL REUS W/ TWL LRG LVL3 (GOWN DISPOSABLE) ×1 IMPLANT
GOWN STRL REUS W/ TWL XL LVL3 (GOWN DISPOSABLE) ×2 IMPLANT
GOWN STRL REUS W/TWL LRG LVL3 (GOWN DISPOSABLE) ×1
GOWN STRL REUS W/TWL XL LVL3 (GOWN DISPOSABLE) ×2
GUIDEWIRE GLENOID 2.5X220 (WIRE) ×2 IMPLANT
HANDPIECE INTERPULSE COAX TIP (DISPOSABLE) ×1
INSERT HUMERAL 36X6MM 12.5DEG (Insert) ×2 IMPLANT
KIT BASIN OR (CUSTOM PROCEDURE TRAY) ×2 IMPLANT
KIT STABILIZATION SHOULDER (MISCELLANEOUS) ×2 IMPLANT
KIT TURNOVER KIT B (KITS) ×2 IMPLANT
MANIFOLD NEPTUNE II (INSTRUMENTS) ×2 IMPLANT
NEEDLE HYPO 25GX1X1/2 BEV (NEEDLE) IMPLANT
NEEDLE MAYO TROCAR (NEEDLE) ×2 IMPLANT
NS IRRIG 1000ML POUR BTL (IV SOLUTION) ×2 IMPLANT
PACK SHOULDER (CUSTOM PROCEDURE TRAY) ×2 IMPLANT
PAD ARMBOARD 7.5X6 YLW CONV (MISCELLANEOUS) ×6 IMPLANT
PIN GUIDE 2.5X200 (PIN) ×2 IMPLANT
PROXIMAL BODY PTC 11 132.5D (Spacer) ×2 IMPLANT
RESTRAINT HEAD UNIVERSAL NS (MISCELLANEOUS) ×2 IMPLANT
SCREW 5.0X38 SMALL F/PERFORM (Screw) ×2 IMPLANT
SCREW 5.5X26 (Screw) ×2 IMPLANT
SCREW ASSEMBLY COCR TYPE 0 (Screw) ×2 IMPLANT
SCREW BONE THREAD 6.5X35 (Screw) ×2 IMPLANT
SET HNDPC FAN SPRY TIP SCT (DISPOSABLE) ×1 IMPLANT
SMARTMIX MINI TOWER (MISCELLANEOUS)
SPONGE LAP 18X18 X RAY DECT (DISPOSABLE) ×2 IMPLANT
STEM PTC DISTAL 11 90 (Stem) ×2 IMPLANT
STRIP CLOSURE SKIN 1/2X4 (GAUZE/BANDAGES/DRESSINGS) ×2 IMPLANT
SUCTION FRAZIER HANDLE 10FR (MISCELLANEOUS) ×1
SUCTION TUBE FRAZIER 10FR DISP (MISCELLANEOUS) ×1 IMPLANT
SUT ETHIBOND 2 V 37 (SUTURE) ×2 IMPLANT
SUT ETHIBOND NAB CT1 #1 30IN (SUTURE) ×2 IMPLANT
SUT FIBERWIRE #5 38 CONV NDL (SUTURE) ×16
SUT MON AB 3-0 SH 27 (SUTURE) ×1
SUT MON AB 3-0 SH27 (SUTURE) ×1 IMPLANT
SUT VIC AB 0 CT1 18XCR BRD 8 (SUTURE) ×1 IMPLANT
SUT VIC AB 0 CT1 8-18 (SUTURE) ×1
SUT VIC AB 2-0 CT1 27 (SUTURE) ×1
SUT VIC AB 2-0 CT1 TAPERPNT 27 (SUTURE) ×1 IMPLANT
SUTURE FIBERWR #5 38 CONV NDL (SUTURE) ×8 IMPLANT
TOWEL OR 17X26 10 PK STRL BLUE (TOWEL DISPOSABLE) ×2 IMPLANT
TOWER CARTRIDGE SMART MIX (DISPOSABLE) IMPLANT
TOWER SMARTMIX MINI (MISCELLANEOUS) IMPLANT
TRAY FOLEY W/BAG SLVR 14FR (SET/KITS/TRAYS/PACK) IMPLANT
TRAY SHOULDER REV OFFSET 1.5 (Joint) ×2 IMPLANT
WATER STERILE IRR 1000ML POUR (IV SOLUTION) ×2 IMPLANT

## 2018-04-10 NOTE — Transfer of Care (Signed)
Immediate Anesthesia Transfer of Care Note  Patient: Barbara Savage  Procedure(s) Performed: LEFT TOTAL SHOULDER ARTHROPLASTY (Left Shoulder)  Patient Location: PACU  Anesthesia Type:GA combined with regional for post-op pain  Level of Consciousness: awake, alert  and oriented  Airway & Oxygen Therapy: Patient Spontanous Breathing and Patient connected to nasal cannula oxygen  Post-op Assessment: Report given to RN and Post -op Vital signs reviewed and stable  Post vital signs: Reviewed and stable  Last Vitals:  Vitals Value Taken Time  BP 125/59 04/10/2018  2:53 PM  Temp 36.7 C 04/10/2018  2:53 PM  Pulse 102 04/10/2018  3:00 PM  Resp 14 04/10/2018  3:00 PM  SpO2 94 % 04/10/2018  3:00 PM  Vitals shown include unvalidated device data.  Last Pain:  Vitals:   04/10/18 1453  TempSrc:   PainSc: 0-No pain      Patients Stated Pain Goal: 3 (54/65/03 [id number])  Complications: No apparent anesthesia complications

## 2018-04-10 NOTE — Anesthesia Procedure Notes (Signed)
Anesthesia Regional Block: Interscalene brachial plexus block   Pre-Anesthetic Checklist: ,, timeout performed, Correct Patient, Correct Site, Correct Laterality, Correct Procedure, Correct Position, site marked, Risks and benefits discussed,  Surgical consent,  Pre-op evaluation,  At surgeon's request and post-op pain management  Laterality: Left  Prep: chloraprep       Needles:  Injection technique: Single-shot  Needle Type: Echogenic Stimulator Needle     Needle Length: 5cm  Needle Gauge: 22     Additional Needles:   Procedures:, nerve stimulator,,, ultrasound used (permanent image in chart),,,,  Narrative:  Start time: 04/10/2018 10:33 AM End time: 04/10/2018 10:38 AM Injection made incrementally with aspirations every 5 mL.  Performed by: Personally  Anesthesiologist: Janeece Riggers, MD  Additional Notes: Functioning IV was confirmed and monitors were applied.  A 61mm 22ga Arrow echogenic stimulator needle was used. Sterile prep and drape,hand hygiene and sterile gloves were used. Ultrasound guidance: relevant anatomy identified, needle position confirmed, local anesthetic spread visualized around nerve(s)., vascular puncture avoided.  Image printed for medical record. Negative aspiration and negative test dose prior to incremental administration of local anesthetic. The patient tolerated the procedure well.

## 2018-04-10 NOTE — Interval H&P Note (Signed)
Discussed case, risks and benefits with patient again.  All questions answered, no change to history.  We additionally specifically discussed risks of axillary nerve injury, infection, periprosthetic fracture, continued pain and longevity of implants prior to beginning procedure.     Blen Ransome MD  

## 2018-04-10 NOTE — Anesthesia Procedure Notes (Signed)

## 2018-04-10 NOTE — Op Note (Signed)
Orthopaedic Surgery Operative Note (CSN: [id number])  EMERLYN MEHLHOFF  [birth year] Date of Surgery: 04/10/2018   Diagnoses:  Left head split proximal humerus fracture 3 part  Procedure: 23472-reverse total shoulder arthroplasty for fracture   Operative Finding Successful completion of planned procedure.  Diaphyseal fit long stem revised components used to avoid cementing due to patient's stovepipe type proximal humerus and low fracture.  Head was split in the articular surface and there was significant comminution of the greater tuberosity.  Lesser tuberosity was relatively intact.  Good repair of the tuberosities with the vertical fixation as well.  Post-operative plan: The patient will be nonweightbearing in a sling.  The patient will be admitted overnight for observation.  DVT prophylaxis not indicated in this otherwise ambulatory low risk surgery patient.  Pain control with PRN pain medication preferring oral medicines.  Follow up plan will be scheduled in approximately 7 days for incision check and XR AP only, therapy to start at week 2.  Post-Op Diagnosis: Same Surgeons:Primary: Hiram Gash, MD Assistants:Brandon Leslee Home OPA Location: [facility] OR ROOM 03 Anesthesia: Choice Antibiotics: Ancef 2g preop Tourniquet time: * No tourniquets in log * Estimated Blood Loss: 654 Complications: None Specimens: None Implants: Implant Name Type Inv. Item Serial No. Manufacturer Lot No. LRB No. Used Action  BASEPLATE GLENOSPHERE 65KP STD - [id number] Miscellaneous BASEPLATE GLENOSPHERE 17GY STD [id number] [employer]  Left 1 Implanted  GLENOSPHERE REV SHOULDER 36 - [id number] Joint GLENOSPHERE REV SHOULDER 36 [id number] [employer]  Left 1 Implanted  INSERT HUMERAL 36X6MM 12.5DEG - [id number] Insert INSERT HUMERAL 36X6MM 12.5DEG [id number] [employer]  Left 1 Implanted  TRAY 1.5 OFFSET SHOULDER REV - [id number] Joint TRAY 1.5 OFFSET SHOULDER REV [id number] [employer]  Left 1 Implanted  LOCKING  CAP Shoulder  [id number]   Left 1 Implanted  ASSEMBLY SCREW Shoulder  [id number] [employer]  Left 1 Implanted  PTS PROXXIMAL BODY Shoulder  [id number] [employer]  Left 1 Implanted  PTC Distal Stem Shoulder  [id number] [employer]  Left 1 Implanted  BONE SCREW THREAD 6.5X35MM - [id number] Screw BONE SCREW THREAD 6.5X35MM  [employer]  Left 1 Implanted  SCREW 5.0X38 SMALL F/PERFORM - [id number] Screw SCREW 5.0X38 SMALL F/PERFORM  [employer]  Left 1 Implanted  SCREW 5.5X26 - [id number] Screw SCREW 5.5X26  [employer]  Left 1 Implanted    Implants: Tornier 25 mg under standard glenoid baseplate, 35 mm central screw, 28 and 38 mm locking screws, revive stem 11 mm with no spacer, standard proximal body, 6 mm poly-and a low offset tray.  Indications for Surgery:   ODA PLACKE is a 77 y.o. female with fall resulting in a articular surface splitting proximal humerus fracture with significant comminution noted on CT scan.  In light of head split fracture and significant comminution we opted to do an arthroplasty option.  For the patient's age and comminuted tuberosities we elected to perform reverse total shoulder arthroplasty is her option . benefits and risks of operative and nonoperative management were discussed prior to surgery with patient/guardian(s) and informed consent form was completed.  Specific risks including infection, need for additional surgery, dislocation, hematoma formation, continued pain , reduced range of motion and axillary nerve injury.  All discussed.   Procedure:   The patient was identified in the preoperative holding area where the surgical site was marked. The patient was taken to the OR where a procedural timeout was called and the above noted anesthesia was induced.  The patient  was positioned beachchair with spider arm positioner.  Preoperative antibiotics were dosed.  The patient's left shoulder was prepped and draped in the usual sterile fashion.  A second  preoperative timeout was called.      Standard deltopectoral approach was performed with a #10 blade. We dissected down to the subcutaneous tissues and the cephalic vein was taken laterally with the deltoid. Clavipectoral fascia was incised in line with the incision. Deep retractors were placed. The long of the biceps tendon was identified and there was significant tenosynovitis present.  Tenodesis was performed to the pectoralis tendon with #2 Ethibond. The remaining biceps was followed up into the rotator interval where it was released.   We used the bicipital groove as a landmark for the lesser and greater tuberosity fragments.  We were able to mobilize the lesser tuberosity fragment and placed stay sutures in the bone tendon junction to help with mobilization.  This point we were able to identify the  greater tuberosity fragment and 4 #5  FiberWire sutures were used to place into this for eventual repair of the tuberosities.  Once these were both mobilized we took care to identify the shaft fragment as well as the head fragment.  We carefully identified the head fragment were able to manually remove it.    The fragment had articular surface still attached to it however there was obvious articular surface still attached to the greater tuberosity fragment as well.  At this point the axillary nerve was found and palpated and with a tug test noted to be intact.  Protected throughout the remainder of the case with blunt retractors.   We then released the SGHL with bovie cautery prior to placing a curved mayo at the junction of the anterior glenoid well above the axillary nerve and bluntly dissecting the subscapularis from the capsule.  We then carefully protected the axillary nerve as we gently released the inferior capsule to fully mobilize the subscapularis.  An anterior deltoid retractor was then placed as well as a small Hohmann retractor superiorly.  The glenoid was relatively preserved as we would  expect in this fracture patient.  The remaining labrum was removed circumferentially taking great care not to disrupt the posterior capsule.   The glenoid drill guide was placed and used to drill a guide pin in the center, inferior position. The glenoid face was then reamed concentrically over the guide wire. The center hole was drilled over the guidepin in a near anatomic angle of version. Next the glenoid vault was drilled back to a depth of  35 mm.  We tapped and then placed a 75m size baseplate with 0 lateralization was selected with a 6.5 mm x 35 mm length central screw.  The base plate was screwed into the glenoid vault obtaining secure fixation. We next placed superior and inferior locking screws for additional fixation.  Next a 36 mm glenosphere was selected and impacted onto the baseplate. The center screw was tightened.  We then repositioned the arm to give access to the humeral shaft fragment.  Drill holes were placed and fiberwire sutures in the shaft for vertical fixation of the tuberosities.    We attempted to broach with the flex standard and long stemmed revision liner but there was significant distal bone loss fracture leaving uKorealittle bone for purchase.  Patient was switched to the Revive components.   We then began broaching with a 9 and finishing with an 11 broach achieving chatter and purchase in the diaphysis  with this.  Standard body without spacer was used.  We trialed with multiple size tray and polyethylene options and selected a 0 high which provided good stability and range of motion without excess soft tissue tension. The offset was dialed in to match the normal anatomy. The shoulder was trialed.  There was good ROM in all planes and the shoulder was stable with no inferior translation.  We then mobilized her tuberosities again and placed the anterior deep limbs of the 4 #5 fiber wires around the stem.  1 of these was tied down fixing the greater tuberosity in place after  bone graft harvest from the humeral head component was placed underneath.  A +0 high offset tray was selected and impacted onto the stem.   A 36+6 polyethylene liner was impacted onto the stem.  The joint was reduced and thoroughly irrigated with pulsatile lavage. The remaining sutures were then placed through the subscapularis and the bone tendon junction and the tuberosities were reduced after bone graft placed beneath as autograft at the subscap.  We horizontally secured the tuberosities before placing vertical fixation with the suture that was placed into the shaft.  Tuberosities moved as a unit were happy with her overall reduction.  This was checked on fluoroscopy confirming our position.  We irrigated copiously at this point.  Hemostasis was obtained. The deltopectoral interval was reapproximated with #1 Ethibond. The subcutaneous tissues were closed with 3-0 Vicryl and the skin was closed with running monocryl.    The wounds were cleaned and dried and an Aquacel dressing was placed. The drapes taken down. The arm was placed into sling with abduction pillow. Patient was awakened, extubated, and transferred to the recovery room in stable condition. There were no intraoperative complications. The sponge, needle, and attention counts were correct at the end of the case.    Joya Gaskins, OPA-C, present and scrubbed throughout the case, critical for completion in a timely fashion, and for retraction, instrumentation, closure.

## 2018-04-10 NOTE — Anesthesia Postprocedure Evaluation (Signed)
Anesthesia Post Note  Patient: Barbara Savage  Procedure(s) Performed: LEFT TOTAL SHOULDER ARTHROPLASTY (Left Shoulder)     Patient location during evaluation: PACU Anesthesia Type: General and Regional Level of consciousness: awake and alert Pain management: pain level controlled Vital Signs Assessment: post-procedure vital signs reviewed and stable Respiratory status: spontaneous breathing, nonlabored ventilation, respiratory function stable and patient connected to nasal cannula oxygen Cardiovascular status: blood pressure returned to baseline and stable Postop Assessment: no apparent nausea or vomiting Anesthetic complications: no    Last Vitals:  Vitals:   04/10/18 1529 04/10/18 1604  BP: (!) 96/51 140/69  Pulse: (!) 104 (!) 101  Resp: (!) 21   Temp: 36.7 C 36.9 C  SpO2: 93% 93%    Last Pain:  Vitals:   04/10/18 1604  TempSrc: Oral  PainSc:                  Barnet [place]

## 2018-04-10 NOTE — Plan of Care (Signed)

## 2018-04-10 NOTE — Anesthesia Preprocedure Evaluation (Addendum)
Anesthesia Evaluation  Patient identified by MRN, date of birth, ID band Patient awake    Reviewed: Allergy & Precautions, H&P , NPO status , Patient's Chart, lab work & pertinent test results, reviewed documented beta blocker date and time   History of Anesthesia Complications (+) PROLONGED EMERGENCE, Family history of anesthesia reaction and history of anesthetic complications  Airway Mallampati: II  TM Distance: >3 FB Neck ROM: full    Dental no notable dental hx.    Pulmonary asthma , former smoker,    Pulmonary exam normal breath sounds clear to auscultation       Cardiovascular Exercise Tolerance: Good hypertension,  Rhythm:regular Rate:Normal     Neuro/Psych negative neurological ROS  negative psych ROS   GI/Hepatic negative GI ROS, Neg liver ROS,   Endo/Other  negative endocrine ROS  Renal/GU negative Renal ROS  negative genitourinary   Musculoskeletal  (+) Arthritis , Osteoarthritis,    Abdominal   Peds  Hematology negative hematology ROS (+)   Anesthesia Other Findings   Reproductive/Obstetrics negative OB ROS                             Anesthesia Physical Anesthesia Plan  ASA: II  Anesthesia Plan: General   Post-op Pain Management:  Regional for Post-op pain   Induction: Intravenous  PONV Risk Score and Plan: 3 and Ondansetron and Treatment may vary due to age or medical condition  Airway Management Planned: Oral ETT and LMA  Additional Equipment:   Intra-op Plan:   Post-operative Plan: Extubation in OR  Informed Consent: I have reviewed the patients History and Physical, chart, labs and discussed the procedure including the risks, benefits and alternatives for the proposed anesthesia with the patient or authorized representative who has indicated his/her understanding and acceptance.   Dental Advisory Given  Plan Discussed with: CRNA, Anesthesiologist and  Surgeon  Anesthesia Plan Comments: (  )        Anesthesia Quick Evaluation

## 2018-04-11 MED ORDER — TRAMADOL HCL 50 MG PO TABS: ORAL_TABLET | ORAL | 0 refills | Status: DC

## 2018-04-11 MED ORDER — ONDANSETRON HCL 4 MG PO TABS: 4.0000 mg | ORAL_TABLET | Freq: Three times a day (TID) | ORAL | 1 refills | Status: AC | PRN

## 2018-04-11 MED ORDER — ACETAMINOPHEN 500 MG PO TABS: 1000.0000 mg | ORAL_TABLET | Freq: Three times a day (TID) | ORAL | 0 refills | Status: AC

## 2018-04-11 MED ORDER — OXYCODONE HCL 5 MG PO TABS: ORAL_TABLET | ORAL | 0 refills | Status: AC

## 2018-04-11 NOTE — Evaluation (Signed)
Occupational Therapy Evaluation Patient Details Name: Barbara Savage MRN: [id number] DOB: Nov 27, 1941 Today's Date: 04/11/2018    History of Present Illness s/p reverse total shoulder arthroplasty for proximal humerus fracture.   Clinical Impression   Pt admitted with the above diagnoses and presents with below problem list. At baseline, pt is independent with ADLs. Pt currently min to mod A with UB/LB ADLs, supervision to min guard for functional mobility. Spouse present and included in session. Nerve block still intact. All shoulder education completed. Pt is for d/c home today. [facility] for d/c from OT standpoint.     Follow Up Recommendations  Follow surgeon's recommendation for DC plan and follow-up therapies    Equipment Recommendations  3 in 1 bedside commode    Recommendations for Other Services       Precautions / Restrictions Precautions Precautions: Shoulder Shoulder Interventions: Shoulder sling/immobilizer;Shoulder abduction pillow;At all times;Off for dressing/bathing/exercises Precaution Booklet Issued: Yes (comment) Required Braces or Orthoses: Other Brace/Splint(abduction sling) Restrictions Weight Bearing Restrictions: Yes LUE Weight Bearing: Non weight bearing      Mobility Bed Mobility               General bed mobility comments: sitting EOB at start and end of session. Plans to sleep in chair at home.   Transfers Overall transfer level: Needs assistance Equipment used: None Transfers: Sit to/from Stand Sit to Stand: Min guard         General transfer comment: min guard for safety    Balance Overall balance assessment: Mild deficits observed, not formally tested                                         ADL either performed or assessed with clinical judgement   ADL Overall ADL's : Needs assistance/impaired Eating/Feeding: Set up;Sitting   Grooming: Minimal assistance;Sitting;Standing   Upper Body Bathing: Moderate  assistance;Sitting   Lower Body Bathing: Sit to/from stand;Minimal assistance   Upper Body Dressing : Moderate assistance;Sitting   Lower Body Dressing: Minimal assistance;Sit to/from stand   Toilet Transfer: Min guard;Ambulation Toilet Transfer Details (indicate cue type and reason): 3n1 over toilet Toileting- Clothing Manipulation and Hygiene: Sitting/lateral lean;Set up   Tub/ Shower Transfer: Tub transfer;Moderate assistance;Ambulation Tub/Shower Transfer Details (indicate cue type and reason): pt plans to sponge bathe during recovery Functional mobility during ADLs: Min guard General ADL Comments: Pt completed toilet transfer, pericare, grooming task and UB/LB dressing. Shoulder education provided to pt and spouse.      Vision         Perception     Praxis      Pertinent Vitals/Pain Pain Assessment: No/denies pain(nerve block in effect)     Hand Dominance Right   Extremity/Trunk Assessment Upper Extremity Assessment Upper Extremity Assessment: LUE deficits/detail LUE Deficits / Details: s/p reverse total shoulder arthroplasty for fracture. Nerve block still in effect. LUE: Unable to fully assess due to immobilization   Lower Extremity Assessment Lower Extremity Assessment: Overall WFL for tasks assessed       Communication Communication Communication: No difficulties   Cognition Arousal/Alertness: Awake/alert Behavior During Therapy: WFL for tasks assessed/performed Overall Cognitive Status: Within Functional Limits for tasks assessed                                     General Comments  Exercises Exercises: Other exercises Other Exercises Other Exercises: demonstrated e/w/h hand ROM exercises to pt and spouse, provided handout. Deferred pt practice until nerve block has worn off. Unable to actively move e/w/h this session.    Shoulder Instructions      Home Living Family/patient expects to be discharged to:: Private  residence Living Arrangements: Spouse/significant other Available Help at Discharge: Family Type of Home: House Home Access: Stairs to enter [employer] of Steps: 1   Home Layout: Two level;Able to live on main level with bedroom/bathroom     Bathroom Shower/Tub: Teacher, early years/pre: Standard     Home Equipment: None          Prior Functioning/Environment Level of Independence: Independent        Comments: works as a Glass blower/designer Problem List: Impaired balance (sitting and/or standing);Decreased knowledge of use of DME or AE;Decreased knowledge of precautions;Pain;Impaired UE functional use      OT Treatment/Interventions:      OT Goals(Current goals can be found in the care plan section) Acute Rehab OT Goals Patient Stated Goal: home and regain independence  OT Frequency:     Barriers to D/C:            Co-evaluation              AM-PAC PT "6 Clicks" Daily Activity     Outcome Measure Help from another person eating meals?: None Help from another person taking care of personal grooming?: A Little Help from another person toileting, which includes using toliet, bedpan, or urinal?: A Little Help from another person bathing (including washing, rinsing, drying)?: A Lot Help from another person to put on and taking off regular upper body clothing?: A Lot Help from another person to put on and taking off regular lower body clothing?: A Little 6 Click Score: 17   End of Session Equipment Utilized During Treatment: Other (comment)  Activity Tolerance: Patient tolerated treatment well Patient left: in bed;with call bell/phone within reach;with family/visitor present(sitting EOB)  OT Visit Diagnosis: Unsteadiness on feet (R26.81);Pain Pain - Right/Left: Left Pain - part of body: Shoulder                Time: 7793-9030 OT Time Calculation (min): 34 min Charges:  OT General Charges $OT Visit: 1 Visit OT  Evaluation $OT Eval Low Complexity: 1 Low OT Treatments $Self Care/Home Management : 8-22 mins G-Codes:       Hortencia Pilar 04/11/2018, 10:08 AM

## 2018-04-11 NOTE — Care Management Note (Signed)
Case Management Note  Patient Details  Name: Barbara Savage MRN: [id number] Date of Birth: 01/10/1941  Subjective/Objective: 77 yr old female s/p Left total shoulder arthroplasty.                   Action/Plan: No Home Health therapy needed. Patient will have family support at [place].   Expected Discharge Date:  04/11/18               Expected Discharge Plan:  Home/Self Care  In-House Referral:  NA  Discharge planning Services  CM Consult  Post Acute Care Choice:  Durable Medical Equipment Choice offered to:  NA  DME Arranged:  3-N-1 DME Agency:  [place]:  NA [place] Agency:  NA  Status of Service:  Completed, signed off  If discussed at [place] of Stay Meetings, dates discussed:    Additional Comments:  Ninfa Meeker, RN 04/11/2018, 1:53 PM

## 2018-04-11 NOTE — Discharge Summary (Signed)
Patient ID: Barbara Savage MRN: [id number] DOB/AGE: 01-Jun-1941 77 y.o.  Admit date: 04/10/2018 Discharge date: 04/11/2018  Admission Diagnoses:L proximal humerus fracture with head split  Discharge Diagnoses:  Active Problems:   Closed fracture of left proximal humerus   Past Medical History:  Diagnosis Date  . Arthritis   . Asthma   . Cancer ([place])    skin cancer  . Celiac disease   . Diverticulosis   . Dysrhythmia    occ pvc  . Family history of adverse reaction to anesthesia    mother - slow to awaken  . Pneumonia    years ago  . Sciatica      Procedures Performed: L reverse total shoulder arthroplasty  Discharged Condition: good  Hospital Course: Patient brought in as an outpatient for surgery.  Tolerated procedure well.  Was kept for monitoring overnight for pain control and medical monitoring postop and was found to be stable for DC home the morning after surgery.  Patient was instructed on specific activity restrictions and all questions were answered.   Consults: None  Significant Diagnostic Studies: No additional pertinent studies  Treatments: Surgery  Discharge Exam:  Dressing CDI and sling well fitting,  full and painless ROM throughout hand with DPC of 0. Motor and sensory altered in setting of block.  Well perfused digits.    Disposition: Discharge disposition: 01-Home or Self Care       Discharge Instructions    Call MD for:  persistant nausea and vomiting   Complete by:  As directed    Call MD for:  redness, tenderness, or signs of infection (pain, swelling, redness, odor or green/yellow discharge around incision site)   Complete by:  As directed    Call MD for:  severe uncontrolled pain   Complete by:  As directed    Diet - low sodium heart healthy   Complete by:  As directed    Discharge instructions   Complete by:  As directed    Ophelia Charter MD, MPH [place]. [address] [phone] (tel)    [phone] (fax)   S[place]yer may leave the operative dressing in place until your follow-up appointment. KEEP THE INCISIONS CLEAN AND DRY. Use the Cryocuff, GameReady or Ice as often as possible for the first 3-4 days, then as needed for pain relief.  You may shower on Post-Op Day #2. The dressing is water resistant but do not scrub it as it may start to peel up.  You may remove the sling for showering, but keep a water resistant pillow under the arm to keep both the elbow and shoulder away from the body (mimicking the abduction sling). Gently pat the area dry. Do not soak the shoulder in water. Do not go swimming in the pool or ocean until your sutures are removed.  EXERCISES Wear the sling at all times except when doing your exercises. You may remove the sling for showering, but keep the arm across the chest or in a secondary sling.   Accidental/Purposeful External Rotation and shoulder flexion (reaching behind you) is to be avoided at all costs for the first month. Please perform the exercises:   Elbow / Hand / Wrist  Range of Motion Exercises POST-OP A multi-modal approach will be used to treat your pain. Oxycodone - This is a strong narcotic, to be used only on an "as needed" basis for pain. Meloxicam- An  anti-inflammatory medication Acetaminophen - A non-narcotic pain medicine.  Use 1000mg  three times a day for the first 14 days after surgery If you have any adverse effects with the medications, please call our office.  FOLLOW-UP If you develop a Fever (>101.5), Redness or Drainage from the surgical incision site, please call our office to arrange for an evaluation. Please call the office to schedule a follow-up appointment for a wound check, 7-10 days post-operatively.    IF YOU HAVE ANY QUESTIONS, PLEASE FEEL FREE TO CALL OUR OFFICE.   HELPFUL INFORMATION  Your arm will be in a sling following surgery. You  will be in this sling for the next 3-4 weeks.  I will let you know the exact duration at your follow-up visit.  You may be more comfortable sleeping in a semi-seated position the first few nights following surgery.  Keep a pillow propped under the elbow and forearm for comfort.  If you have a recliner type of chair it might be beneficial.  If not that is fine too, but it would be helpful to sleep propped up with pillows behind your operated shoulder as well under your elbow and forearm.  This will reduce pulling on the suture lines.  We suggest you use the pain medication the first night prior to going to bed, in order to ease any pain when the anesthesia wears off. You should avoid taking pain medications on an empty stomach as it will make you nauseous.  Do not drink alcoholic beverages or take illicit drugs when taking pain medications.  In most states it is against the law to drive while your arm is in a sling. And certainly against the law to drive while taking narcotics.  You may return to work/school in the next couple of days when you feel up to it. Desk work and typing in the sling is fine.  When dressing, put your operative arm in the sleeve first.  When getting undressed, take your operative arm out last.  Loose fitting, button-down shirts are recommended.  Pain medication may make you constipated.  Below are a few solutions to try in this order: Decrease the amount of pain medication if you aren't having pain. Drink lots of decaffeinated fluids. Drink prune juice and/or each dried prunes  If the first 3 don't work start with additional solutions Take Colace - an over-the-counter stool softener Take Senokot - an over-the-counter laxative Take Miralax - a stronger over-the-counter laxative   Increase activity slowly   Complete by:  As directed      Allergies as of 04/11/2018      Reactions   Hydrocodone Swelling   Swelling legs, feet arms, decreased  Urine output- 03/2018    Penicillins Rash   Has patient had a PCN reaction causing immediate rash, facial/tongue/throat swelling, SOB or lightheadedness with hypotension: ##Yes## Has patient had a PCN reaction causing severe rash involving mucus membranes or skin necrosis: No Has patient had a PCN reaction that required hospitalization No Has patient had a PCN reaction occurring within the last 10 years: No If all of the above answers are "NO", then may proceed with Cephalosporin use.   Breo Ellipta [fluticasone Furoate-vilanterol] Other (See Comments)   Worsens asthmatic symptoms      Medication List    STOP taking these medications   HYDROcodone-acetaminophen 5-325 MG tablet Commonly known as:  NORCO/VICODIN   polyethylene glycol powder powder Commonly known as:  GLYCOLAX/MIRALAX     TAKE these medications  acetaminophen 500 MG tablet Commonly known as:  TYLENOL Take 2 tablets (1,000 mg total) by mouth every 8 (eight) hours for 14 days.   B COMPLEX 50 PO Take 1 tablet by mouth daily. Reported on 03/14/2016   celecoxib 200 MG capsule Commonly known as:  CELEBREX Take 200 mg by mouth daily.   GLUCOSAMINE CHONDROITIN MSM PO Take 1 tablet by mouth daily.   TART CHERRY ADVANCED Caps Take 1 capsule by mouth daily.   Lutein 20 MG Caps Take 1 capsule by mouth daily. Reported on 03/14/2016   multivitamin with minerals Tabs tablet Take 1 tablet by mouth daily.   ondansetron 4 MG tablet Commonly known as:  ZOFRAN Take 1 tablet (4 mg total) by mouth every 8 (eight) hours as needed for up to 7 days for nausea or vomiting.   oxyCODONE 5 MG immediate release tablet Commonly known as:  Oxy IR/ROXICODONE Take 1-2 pills every 6 hrs as needed for pain   PROAIR HFA 108 (90 Base) MCG/ACT inhaler Generic drug:  albuterol Inhale 2 puffs into the lungs every 6 (six) hours as needed for wheezing or shortness of breath.   Quercetin 250 MG Tabs Take 1 tablet by mouth daily. For allergy health   traMADol 50  MG tablet Commonly known as:  ULTRAM 1 pill q6 prn pain, use if oxycodone too strong instead of oxycodone dose What changed:    how to take this  when to take this  reasons to take this  additional instructions   TURMERIC PO Take 1 capsule by mouth daily. With black pepper   Vitamin D3 2000 units Tabs Take 1 tablet by mouth daily.

## 2018-04-11 NOTE — Plan of Care (Signed)
  Problem: Education: Goal: Knowledge of General Education information will improve Outcome: Completed/Met   Problem: Health Behavior/Discharge Planning: Goal: Ability to manage health-related needs will improve Outcome: Completed/Met   Problem: Clinical Measurements: Goal: Ability to maintain clinical measurements within normal limits will improve Outcome: Completed/Met Goal: Will remain free from infection Outcome: Completed/Met Goal: Diagnostic test results will improve Outcome: Completed/Met Goal: Respiratory complications will improve Outcome: Completed/Met Goal: Cardiovascular complication will be avoided Outcome: Completed/Met   Problem: Nutrition: Goal: Adequate nutrition will be maintained Outcome: Completed/Met   Problem: Coping: Goal: Level of anxiety will decrease Outcome: Completed/Met   Problem: Elimination: Goal: Will not experience complications related to bowel motility Outcome: Completed/Met Goal: Will not experience complications related to urinary retention Outcome: Completed/Met   Problem: Pain Managment: Goal: General experience of comfort will improve Outcome: Completed/Met   Problem: Safety: Goal: Ability to remain free from injury will improve Outcome: Completed/Met   Problem: Skin Integrity: Goal: Risk for impaired skin integrity will decrease Outcome: Completed/Met

## 2018-04-12 ENCOUNTER — Encounter (HOSPITAL_COMMUNITY): Payer: Self-pay | Admitting: Orthopaedic Surgery

## 2018-04-22 ENCOUNTER — Encounter (HOSPITAL_COMMUNITY): Payer: Self-pay | Admitting: Orthopaedic Surgery

## 2018-07-07 IMAGING — DX DG SHOULDER 1V*L*
1 series · 1 of 1 positions shown · non-contrast
Comparison: 04/03/2018

CLINICAL DATA: Postop shoulder surgery

EXAM:
LEFT SHOULDER - 1 VIEW

[shoulder ap]
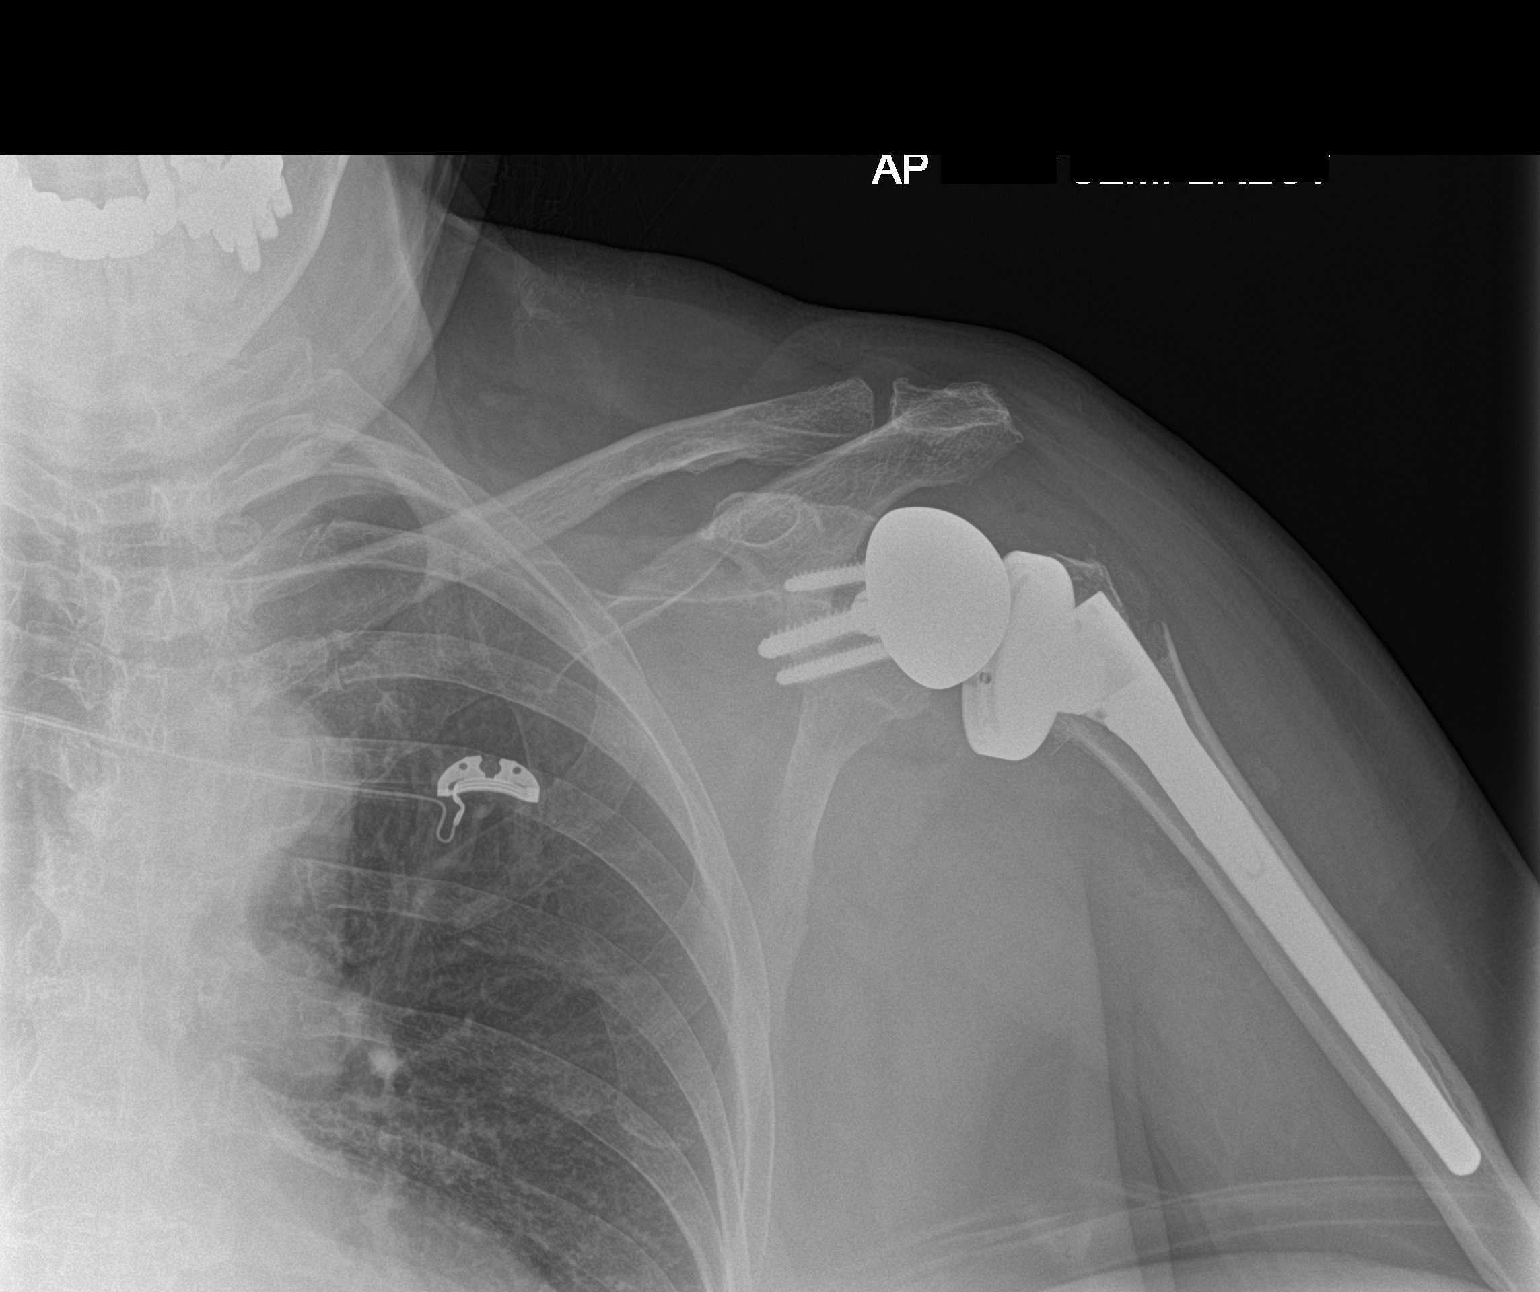

[1 of 1 positions shown; findings below may reference images not displayed]

FINDINGS: Left shoulder replacement in satisfactory position alignment. Small
fracture fragment remains lateral to the humeral prosthesis. No
acute complication. AC joint normal
IMPRESSION: Satisfactory left shoulder replacement.

## 2022-02-11 LAB — LAB REPORT - SCANNED: EGFR: 79

## 2022-09-23 LAB — LAB REPORT - SCANNED: EGFR: 71

## 2022-11-09 ENCOUNTER — Emergency Department (HOSPITAL_COMMUNITY): Payer: Medicare Other

## 2022-11-09 ENCOUNTER — Encounter (HOSPITAL_COMMUNITY): Payer: Self-pay | Admitting: Emergency Medicine

## 2022-11-09 ENCOUNTER — Other Ambulatory Visit: Payer: Self-pay

## 2022-11-09 ENCOUNTER — Emergency Department (HOSPITAL_COMMUNITY)
Admission: EM | Admit: 2022-11-09 | Discharge: 2022-11-09 | Disposition: A | Discharge status: Home / Self Care | Payer: Medicare Other | Attending: Emergency Medicine | Admitting: Emergency Medicine

## 2022-11-09 DIAGNOSIS — K449 Diaphragmatic hernia without obstruction or gangrene: Secondary | ICD-10-CM | POA: Diagnosis not present

## 2022-11-09 DIAGNOSIS — S0990XA Unspecified injury of head, initial encounter: Secondary | ICD-10-CM | POA: Diagnosis present

## 2022-11-09 DIAGNOSIS — W19XXXA Unspecified fall, initial encounter: Secondary | ICD-10-CM | POA: Insufficient documentation

## 2022-11-09 DIAGNOSIS — I700 Atherosclerosis of aorta: Secondary | ICD-10-CM | POA: Diagnosis not present

## 2022-11-09 DIAGNOSIS — I2510 Atherosclerotic heart disease of native coronary artery without angina pectoris: Secondary | ICD-10-CM | POA: Diagnosis not present

## 2022-11-09 DIAGNOSIS — S42034A Nondisplaced fracture of lateral end of right clavicle, initial encounter for closed fracture: Secondary | ICD-10-CM | POA: Diagnosis not present

## 2022-11-09 DIAGNOSIS — S060X1A Concussion with loss of consciousness of 30 minutes or less, initial encounter: Secondary | ICD-10-CM | POA: Diagnosis not present

## 2022-11-09 DIAGNOSIS — F0390 Unspecified dementia without behavioral disturbance: Secondary | ICD-10-CM | POA: Diagnosis not present

## 2022-11-09 DIAGNOSIS — N200 Calculus of kidney: Secondary | ICD-10-CM | POA: Insufficient documentation

## 2022-11-09 DIAGNOSIS — K5730 Diverticulosis of large intestine without perforation or abscess without bleeding: Secondary | ICD-10-CM | POA: Diagnosis not present

## 2022-11-09 DIAGNOSIS — Z85828 Personal history of other malignant neoplasm of skin: Secondary | ICD-10-CM | POA: Diagnosis not present

## 2022-11-09 HISTORY — DX: Unspecified dementia, unspecified severity, without behavioral disturbance, psychotic disturbance, mood disturbance, and anxiety: F03.90

## 2022-11-09 LAB — URINALYSIS, ROUTINE W REFLEX MICROSCOPIC
Bilirubin Urine: NEGATIVE
Glucose, UA: NEGATIVE mg/dL
Hgb urine dipstick: NEGATIVE
Ketones, ur: NEGATIVE mg/dL
Leukocytes,Ua: NEGATIVE
Nitrite: NEGATIVE
Protein, ur: NEGATIVE mg/dL
Specific Gravity, Urine: 1.006 (ref 1.005–1.030)
pH: 6 (ref 5.0–8.0)

## 2022-11-09 LAB — CBC
HCT: 42.1 % (ref 36.0–46.0)
Hemoglobin: 13.5 g/dL (ref 12.0–15.0)
MCH: 31.5 pg (ref 26.0–34.0)
MCHC: 32.1 g/dL (ref 30.0–36.0)
MCV: 98.4 fL (ref 80.0–100.0)
Platelets: 269 10*3/uL (ref 150–400)
RBC: 4.28 MIL/uL (ref 3.87–5.11)
RDW: 13.5 % (ref 11.5–15.5)
WBC: 8.9 10*3/uL (ref 4.0–10.5)
nRBC: 0 % (ref 0.0–0.2)

## 2022-11-09 LAB — COMPREHENSIVE METABOLIC PANEL
ALT: 17 U/L (ref 0–44)
AST: 26 U/L (ref 15–41)
Albumin: 4.4 g/dL (ref 3.5–5.0)
Alkaline Phosphatase: 81 U/L (ref 38–126)
Anion gap: 9 (ref 5–15)
BUN: 14 mg/dL (ref 8–23)
CO2: 27 mmol/L (ref 22–32)
Calcium: 9.4 mg/dL (ref 8.9–10.3)
Chloride: 105 mmol/L (ref 98–111)
Creatinine, Ser: 0.74 mg/dL (ref 0.44–1.00)
GFR, Estimated: >60 mL/min (ref >60)
Glucose, Bld: 90 mg/dL (ref 70–99)
Potassium: 4.1 mmol/L (ref 3.5–5.1)
Sodium: 141 mmol/L (ref 135–145)
Total Bilirubin: 0.7 mg/dL (ref 0.3–1.2)
Total Protein: 7.4 g/dL (ref 6.5–8.1)

## 2022-11-09 MED ORDER — IOHEXOL 300 MG/ML  SOLN
100.0000 mL | Freq: Once | INTRAMUSCULAR | Status: AC | PRN
  Administered 2022-11-09: 100 mL via INTRAVENOUS

## 2022-11-09 NOTE — ED Provider Notes (Signed)
[facility] EMERGENCY DEPARTMENT Provider Note   CSN: [id number] Arrival date & time: 11/09/22  1339     History  Chief Complaint  Patient presents with   Altered Mental Status    Barbara Savage is a 81 y.o. female.  Pt is a 81 yo female with a pmhx significant for dementia, celiac disease, and skin cancer.  Pt was in the drug store last night and fell.  The husband did not see the fall, but she was a little confused after the fall.  She was complaining of right shoulder pain, so he took her to [facility] who sent her to ortho.  She went to [facility] who did right shoulder x-rays which showed a right clavicle fx.  Pt does not remember about 45 minutes from last night.  Pt's husband called her neurologist who recommended getting a CT scan.  Pt is her normal MS now.  Pt also complains of some neck pain and some right sided chest and abd pain.  She's been eating/drinking well today.  No dizziness.       Home Medications Prior to Admission medications   Medication Sig Start Date End Date Taking? Authorizing Provider  B Complex Vitamins (B COMPLEX 50 PO) Take 1 tablet by mouth daily. Reported on 03/14/2016    [provider]  celecoxib (CELEBREX) 200 MG capsule Take 200 mg by mouth daily.    [provider]  Cholecalciferol (VITAMIN D3) 2000 units TABS Take 1 tablet by mouth daily.    [provider]  Lutein 20 MG CAPS Take 1 capsule by mouth daily. Reported on 03/14/2016    [provider]  Misc Natural Products (Angels MSM PO) Take 1 tablet by mouth daily.    [provider]  Misc Natural Products (TART CHERRY ADVANCED) CAPS Take 1 capsule by mouth daily.    [provider]  Multiple Vitamin (MULTIVITAMIN WITH MINERALS) TABS tablet Take 1 tablet by mouth daily.    [provider]  PROAIR HFA 108 [phone] Base) MCG/ACT inhaler Inhale 2 puffs into the lungs every 6 (six) hours as needed for wheezing or shortness of  breath.  12/07/15   [provider]  Quercetin 250 MG TABS Take 1 tablet by mouth daily. For allergy health    [provider]  traMADol (ULTRAM) 50 MG tablet 1 pill q6 prn pain, use if oxycodone too strong instead of oxycodone dose 04/11/18   Hiram Gash, MD  TURMERIC PO Take 1 capsule by mouth daily. With black pepper    [provider]      Allergies    Hydrocodone, Penicillins, and Breo ellipta [fluticasone furoate-vilanterol]    Review of Systems   Review of Systems  Gastrointestinal:  Positive for abdominal pain.  Musculoskeletal:        Right shoulder pain Right lower chest pain  Neurological:        Memory loss  All other systems reviewed and are negative.   Physical Exam Updated Vital Signs BP (!) 130/98 (BP Location: Left Arm)   Pulse 70   Temp 98.6 F (37 C) (Oral)   Resp 20   SpO2 99%  Physical Exam Vitals and nursing note reviewed.  Constitutional:      Appearance: Normal appearance.  HENT:     Head: Normocephalic and atraumatic.     Right Ear: External ear normal.     Left Ear: External ear normal.     Nose: Nose normal.  Mouth/Throat:     Mouth: Mucous membranes are dry.  Eyes:     Extraocular Movements: Extraocular movements intact.     Conjunctiva/sclera: Conjunctivae normal.     Pupils: Pupils are equal, round, and reactive to light.  Cardiovascular:     Rate and Rhythm: Normal rate and regular rhythm.     Pulses: Normal pulses.     Heart sounds: Normal heart sounds.  Pulmonary:     Effort: Pulmonary effort is normal.     Breath sounds: Normal breath sounds.  Abdominal:     General: Abdomen is flat. Bowel sounds are normal.     Palpations: Abdomen is soft.  Musculoskeletal:        General: Normal range of motion.     Cervical back: Normal range of motion and neck supple.  Skin:    General: Skin is warm.     Capillary Refill: Capillary refill takes less than 2 seconds.  Neurological:     General: No focal  deficit present.     Mental Status: She is alert and oriented to person, place, and time.  Psychiatric:        Mood and Affect: Mood normal.        Behavior: Behavior normal.     ED Results / Procedures / Treatments   Labs (all labs ordered are listed, but only abnormal results are displayed) Labs Reviewed  URINALYSIS, ROUTINE W REFLEX MICROSCOPIC - Abnormal; Notable for the following components:      Result Value   Color, Urine STRAW (*)    All other components within normal limits  COMPREHENSIVE METABOLIC PANEL  CBC    EKG None  Radiology CT Head Wo Contrast  Result Date: 11/09/2022 CLINICAL DATA:  Fall last night. EXAM: CT HEAD WITHOUT CONTRAST CT CERVICAL SPINE WITHOUT CONTRAST TECHNIQUE: Multidetector CT imaging of the head and cervical spine was performed following the standard protocol without intravenous contrast. Multiplanar CT image reconstructions of the cervical spine were also generated. RADIATION DOSE REDUCTION: This exam was performed according to the departmental dose-optimization program which includes automated exposure control, adjustment of the mA and/or kV according to patient size and/or use of iterative reconstruction technique. COMPARISON:  None Available. FINDINGS: CT HEAD FINDINGS Brain: Mild cerebral atrophy. Mild white matter hypodensity. Negative for acute infarct, hemorrhage, mass Vascular: Negative for hyperdense vessel Skull: Negative Sinuses/Orbits: Near complete opacification right maxillary sinus with central metal foreign body. This has the appearance of a displaced dental implant in the sinus and was present on cervical spine x-rays of 11/20/2017. Diffuse bony thickening on the right maxillary sinus. Mild mucosal edema right ethmoid sinus. Negative orbit Other: None CT CERVICAL SPINE FINDINGS Alignment: Mild anterolisthesis C2-3.  Remaining alignment normal Skull base and vertebrae: Negative for fracture Soft tissues and spinal canal: Negative for soft  tissue mass or edema Disc levels: Advanced cervical spondylosis C3 through C7. Disc degeneration and spurring with spinal and foraminal stenosis at these levels. Upper chest: Lung apices clear bilaterally Other: None IMPRESSION: No acute intracranial abnormality. Negative for cervical spine fracture. Advanced cervical spondylosis. Electronically Signed   By: Franchot Gallo M.D.   On: 11/09/2022 17:12   CT Cervical Spine Wo Contrast  Result Date: 11/09/2022 CLINICAL DATA:  Fall last night. EXAM: CT HEAD WITHOUT CONTRAST CT CERVICAL SPINE WITHOUT CONTRAST TECHNIQUE: Multidetector CT imaging of the head and cervical spine was performed following the standard protocol without intravenous contrast. Multiplanar CT image reconstructions of the cervical spine were also generated.  RADIATION DOSE REDUCTION: This exam was performed according to the departmental dose-optimization program which includes automated exposure control, adjustment of the mA and/or kV according to patient size and/or use of iterative reconstruction technique. COMPARISON:  None Available. FINDINGS: CT HEAD FINDINGS Brain: Mild cerebral atrophy. Mild white matter hypodensity. Negative for acute infarct, hemorrhage, mass Vascular: Negative for hyperdense vessel Skull: Negative Sinuses/Orbits: Near complete opacification right maxillary sinus with central metal foreign body. This has the appearance of a displaced dental implant in the sinus and was present on cervical spine x-rays of 11/20/2017. Diffuse bony thickening on the right maxillary sinus. Mild mucosal edema right ethmoid sinus. Negative orbit Other: None CT CERVICAL SPINE FINDINGS Alignment: Mild anterolisthesis C2-3.  Remaining alignment normal Skull base and vertebrae: Negative for fracture Soft tissues and spinal canal: Negative for soft tissue mass or edema Disc levels: Advanced cervical spondylosis C3 through C7. Disc degeneration and spurring with spinal and foraminal stenosis at these  levels. Upper chest: Lung apices clear bilaterally Other: None IMPRESSION: No acute intracranial abnormality. Negative for cervical spine fracture. Advanced cervical spondylosis. Electronically Signed   By: Franchot Gallo M.D.   On: 11/09/2022 17:12   CT CHEST ABDOMEN PELVIS W CONTRAST  Result Date: 11/09/2022 CLINICAL DATA:  Fall with right shoulder pain EXAM: CT CHEST, ABDOMEN, AND PELVIS WITH CONTRAST TECHNIQUE: Multidetector CT imaging of the chest, abdomen and pelvis was performed following the standard protocol during bolus administration of intravenous contrast. RADIATION DOSE REDUCTION: This exam was performed according to the departmental dose-optimization program which includes automated exposure control, adjustment of the mA and/or kV according to patient size and/or use of iterative reconstruction technique. CONTRAST:  160m OMNIPAQUE IOHEXOL 300 MG/ML  SOLN COMPARISON:  CT abdomen pelvis 06/08/2016 FINDINGS: CT CHEST FINDINGS Cardiovascular: Normal heart size. No pericardial effusion. Thoracic aorta is nonaneurysmal. Scattered atherosclerotic calcifications of the aorta and coronary arteries. Central pulmonary vasculature is within normal limits. Mediastinum/Nodes: No enlarged mediastinal, hilar, or axillary lymph nodes. Thyroid gland, trachea, and esophagus demonstrate no significant findings. Lungs/Pleura: Lungs are clear. No pleural effusion or pneumothorax. Musculoskeletal: Acute mildly displaced fracture involving the distal third of the right clavicle (series 3, image 61). AC joint alignment is maintained without dislocation. Mild degenerative changes of the right glenohumeral and acromioclavicular joints. Sternoclavicular joint alignment is maintained. Prior reverse left shoulder arthroplasty. Remaining osseous structures of the bony thorax are intact. No chest wall hematoma. CT ABDOMEN PELVIS FINDINGS Hepatobiliary: No focal liver abnormality is seen. No gallstones, gallbladder wall  thickening, or biliary dilatation. Pancreas: Unremarkable. No pancreatic ductal dilatation or surrounding inflammatory changes. Spleen: Normal in size without focal abnormality. Adrenals/Urinary Tract: Stable 1.9 cm right adrenal gland nodule with internal density of 13 HU, likely benign adenoma. No follow-up recommended given stability. Unremarkable left adrenal gland. 3 mm nonobstructing stone in the right kidney. Kidneys enhance symmetrically. No hydronephrosis. Urinary bladder within normal limits for the degree of distension. Stomach/Bowel: Small hiatal hernia. Stomach is otherwise within normal limits. Appendix appears normal. Extensive sigmoid diverticulosis. No evidence of bowel wall thickening, distention, or inflammatory changes. Vascular/Lymphatic: Aortic atherosclerosis. No enlarged abdominal or pelvic lymph nodes. Reproductive: Small fibroids within the uterus.  No adnexal masses. Other: No free fluid. No abdominopelvic fluid collection. No pneumoperitoneum. No abdominal wall hernia. Musculoskeletal: Lumbar levocurvature with multilevel spondylosis. Pelvic bony ring intact. No acute osseous findings. No abdominal wall hematoma. IMPRESSION: 1. Acute mildly displaced fracture of the distal right clavicle. 2. Otherwise, no acute traumatic injury within the chest,  abdomen, or pelvis. 3. Nonobstructing right nephrolithiasis. 4. Extensive sigmoid diverticulosis without evidence of acute diverticulitis. 5. Small hiatal hernia. 6. Aortic and coronary artery atherosclerosis (ICD10-I70.0). Electronically Signed   By: Davina Poke D.O.   On: 11/09/2022 16:49    Procedures Procedures    Medications Ordered in ED Medications  iohexol (OMNIPAQUE) 300 MG/ML solution 100 mL (100 mLs Intravenous Contrast Given 11/09/22 1615)    ED Course/ Medical Decision Making/ A&P                           Medical Decision Making Amount and/or Complexity of Data Reviewed Labs: ordered. Radiology:  ordered.  Risk Prescription drug management.   This patient presents to the ED for concern of fall, this involves an extensive number of treatment options, and is a complaint that carries with it a high risk of complications and morbidity.  The differential diagnosis includes multiple trauma   Co morbidities that complicate the patient evaluation  dementia, celiac disease, and skin cancer   Additional history obtained:  Additional history obtained from epic chart review External records from outside source obtained and reviewed including husband   Lab Tests:  I Ordered, and personally interpreted labs.  The pertinent results include:  cbc nl, cmp nl, ua nl   Imaging Studies ordered:  I ordered imaging studies including ct head/c-spine/chest/abd/pelvis  I independently visualized and interpreted imaging which showed  CT head/CT c-spine No acute intracranial abnormality. Negative for cervical spine  fracture. Advanced cervical spondylosis  CT chest/abd/pelvis: 1. Acute mildly displaced fracture of the distal right clavicle.  2. Otherwise, no acute traumatic injury within the chest, abdomen,  or pelvis.  3. Nonobstructing right nephrolithiasis.  4. Extensive sigmoid diverticulosis without evidence of acute  diverticulitis.  5. Small hiatal hernia.  6. Aortic and coronary artery atherosclerosis (ICD10-I70.0).   I agree with the radiologist interpretation    Medicines ordered and prescription drug management:  Pt did not want anything for pain while here.  She has pain meds at home. I have reviewed the patients home medicines and have made adjustments as needed   Test Considered:  ct   Problem List / ED Course:  Fall with memory loss:  likely concussion.  Pt told to f/u with the concussion clinic or with her neurologist. Right clavicle fx:  mgmt per ortho   Reevaluation:  After the interventions noted above, I reevaluated the patient and found that they  have :improved   Social Determinants of Health:  Lives at home with husband   Dispostion:  After consideration of the diagnostic results and the patients response to treatment, I feel that the patent would benefit from discharge with outpatient f/u.  Return if worse.        Final Clinical Impression(s) / ED Diagnoses Final diagnoses:  Concussion with loss of consciousness of 30 minutes or less, initial encounter  Closed nondisplaced fracture of acromial end of right clavicle, initial encounter    Rx / DC Orders ED Discharge Orders     None         Isla Pence, MD 11/09/22 1750

## 2022-11-09 NOTE — ED Triage Notes (Signed)
Pt fell last night. Was seen due to right shoulder pain. Dx with right clavicle fx. Bruising noted. Husband with pt states pt has about a 56mn period of loss of memory. [place]. Pt hx of "mild dementia" per husband. Pt aware of person, not time/date/place

## 2023-02-13 LAB — LAB REPORT - SCANNED: EGFR: 77

## 2023-10-09 LAB — LAB REPORT - SCANNED: EGFR: 68

## 2023-10-31 ENCOUNTER — Encounter (HOSPITAL_COMMUNITY)
Admission: RE | Admit: 2023-10-31 | Discharge: 2023-10-31 | Disposition: A | Discharge status: Home / Self Care | Payer: Medicare Other | Source: Ambulatory Visit | Attending: Optometry | Admitting: Optometry

## 2023-10-31 ENCOUNTER — Encounter (HOSPITAL_COMMUNITY): Payer: Self-pay

## 2023-10-31 ENCOUNTER — Other Ambulatory Visit: Payer: Self-pay

## 2023-11-01 NOTE — H&P (Signed)
Surgical History & Physical  Patient Name: Barbara Savage  DOB: [birth year]  Surgery: Cataract extraction with intraocular lens implant phacoemulsification; Left Eye Surgeon: Pecolia Ades MD Surgery Date: 11/02/2023 Pre-Op Date: 10/09/2023  HPI: A 89 Yr. old female patient present for cataract eval per Dr. Charise Killian. 1. The patient complains of difficulty with all activities of daily living, which began 1 year ago. The left eye is affected. The condition is constant and worsening. This is negatively affecting the patient's quality of life and the patient is unable to function adequately in life with the current level of vision. Patient is taking Plaquenil 200mg  qd then twice on M/W/F for about a year her husband believes. She sees Dr. DeFoor, Rheumatologist. Patient does have memory problems. Her husband Zyaira Vejar is helping with questions.  Medical History: Cataracts OD: Choroidal Nevus  Lung Problems memory loss  Review of Systems Neurological memory loss Respiratory Asthma All recorded systems are negative except as noted above.  Social Never smoked   Medication Aricept ,  celecoxib ,  rosuvastatin ,  hydroxychloroquine ,  alendronate ,  memantine   Sx/Procedures Shoulder Replacement  Drug Allergies  penicillin   History & Physical: Heent: cataracts  NECK: supple without bruits LUNGS: lungs clear to auscultation CV: regular rate and rhythm Abdomen: soft and non-tender  Impression & Plan: Assessment: 1.  CATARACT AGE-RELATED NUCLEAR; Both Eyes (H25.13) 2.  Myopia ; Both Eyes (H52.13)  Plan: 1.  Cataracts are visually significant and account for the patient's complaints. Discussed all risks, benefits, procedures and recovery, including infection, loss of vision and eye, need for glasses after surgery or additional procedures. Patient understands changing glasses will not improve vision. Patient indicated understanding of procedure. All questions answered.  Patient desires to have surgery, recommend phacoemulsification with intraocular lens. Patient to have preliminary testing necessary (Argos/IOL Master, Mac OCT, TOPO) Educational materials provided:Cataract. RECOMMEND STANDARD VS EYEHANCE VS TORIC VS VIVTY  Plan: - Proceed with cataract surgery OS, followed by OD - DIB00 with best distance vision OU vs Near OU. Patient to decide and call us back - Early Dementia, very talkative - Ok with lying flat - No prior eye surgeries  2.  Per Dr. Charise Killian

## 2023-11-02 ENCOUNTER — Ambulatory Visit (HOSPITAL_COMMUNITY): Payer: Medicare Other | Admitting: Certified Registered"

## 2023-11-02 ENCOUNTER — Ambulatory Visit (HOSPITAL_COMMUNITY)
Admission: RE | Admit: 2023-11-02 | Discharge: 2023-11-02 | Disposition: A | Discharge status: Home / Self Care | Payer: Medicare Other | Attending: Optometry | Admitting: Optometry

## 2023-11-02 ENCOUNTER — Ambulatory Visit (HOSPITAL_BASED_OUTPATIENT_CLINIC_OR_DEPARTMENT_OTHER): Payer: Medicare Other | Admitting: Certified Registered"

## 2023-11-02 ENCOUNTER — Encounter (HOSPITAL_COMMUNITY)
Admission: RE | Disposition: A | Discharge status: Home / Self Care | Payer: Self-pay | Source: Home / Self Care | Attending: Optometry

## 2023-11-02 DIAGNOSIS — H2512 Age-related nuclear cataract, left eye: Secondary | ICD-10-CM | POA: Diagnosis not present

## 2023-11-02 DIAGNOSIS — F0390 Unspecified dementia without behavioral disturbance: Secondary | ICD-10-CM | POA: Diagnosis not present

## 2023-11-02 DIAGNOSIS — J45909 Unspecified asthma, uncomplicated: Secondary | ICD-10-CM | POA: Diagnosis not present

## 2023-11-02 DIAGNOSIS — Z87891 Personal history of nicotine dependence: Secondary | ICD-10-CM | POA: Diagnosis not present

## 2023-11-02 SURGERY — CATARACT EXTRACTION PHACO AND INTRAOCULAR LENS PLACEMENT (IOC) with placement of Corticosteroid
Anesthesia: Monitor Anesthesia Care | Site: Eye | Laterality: Left

## 2023-11-02 MED ORDER — TETRACAINE HCL 0.5 % OP SOLN
1.0000 [drp] | OPHTHALMIC | Status: AC | PRN
Start: 2023-11-02 — End: 2023-11-02
  Administered 2023-11-02 (×3): 1 [drp] via OPHTHALMIC

## 2023-11-02 MED ORDER — TROPICAMIDE 1 % OP SOLN
1.0000 [drp] | OPHTHALMIC | Status: AC | PRN
Start: 2023-11-02 — End: 2023-11-02
  Administered 2023-11-02 (×3): 1 [drp] via OPHTHALMIC

## 2023-11-02 MED ORDER — LIDOCAINE HCL (PF) 1 % IJ SOLN
INTRAMUSCULAR | Status: DC | PRN
  Administered 2023-11-02: 1 mL

## 2023-11-02 MED ORDER — PHENYLEPHRINE HCL 2.5 % OP SOLN
1.0000 [drp] | OPHTHALMIC | Status: AC | PRN
Start: 2023-11-02 — End: 2023-11-02
  Administered 2023-11-02 (×3): 1 [drp] via OPHTHALMIC

## 2023-11-02 MED ORDER — SIGHTPATH DOSE#1 NA HYALUR & NA CHOND-NA HYALUR IO KIT
PACK | INTRAOCULAR | Status: DC | PRN
  Administered 2023-11-02: 1 via OPHTHALMIC

## 2023-11-02 MED ORDER — MOXIFLOXACIN HCL 5 MG/ML IO SOLN
INTRAOCULAR | Status: DC | PRN
  Administered 2023-11-02: .3 mL via INTRACAMERAL

## 2023-11-02 MED ORDER — FENTANYL CITRATE (PF) 100 MCG/2ML IJ SOLN
INTRAMUSCULAR | Status: AC
  Filled 2023-11-02: qty 2

## 2023-11-02 MED ORDER — LIDOCAINE HCL 3.5 % OP GEL
1.0000 | Freq: Once | OPHTHALMIC | Status: AC
  Administered 2023-11-02: 1 via OPHTHALMIC

## 2023-11-02 MED ORDER — STERILE WATER FOR IRRIGATION IR SOLN
Status: DC | PRN
  Administered 2023-11-02: 1

## 2023-11-02 MED ORDER — BSS IO SOLN
INTRAOCULAR | Status: DC | PRN
  Administered 2023-11-02: 15 mL via INTRAOCULAR

## 2023-11-02 MED ORDER — MIDAZOLAM HCL 2 MG/2ML IJ SOLN
INTRAMUSCULAR | Status: AC
  Filled 2023-11-02: qty 2

## 2023-11-02 MED ORDER — POVIDONE-IODINE 5 % OP SOLN
OPHTHALMIC | Status: DC | PRN
  Administered 2023-11-02: 1 via OPHTHALMIC

## 2023-11-02 MED ORDER — PHENYLEPHRINE-KETOROLAC 1-0.3 % IO SOLN
INTRAOCULAR | Status: DC | PRN
  Administered 2023-11-02: 500 mL via OPHTHALMIC

## 2023-11-02 SURGICAL SUPPLY — 14 items
CATARACT SUITE SIGHTPATH (MISCELLANEOUS) ×1
CLOTH BEACON ORANGE TIMEOUT ST (SAFETY) ×1 IMPLANT
DRSG TEGADERM 4X4.75 (GAUZE/BANDAGES/DRESSINGS) ×1 IMPLANT
EYE SHIELD UNIVERSAL CLEAR (GAUZE/BANDAGES/DRESSINGS) IMPLANT
FEE CATARACT SUITE SIGHTPATH (MISCELLANEOUS) ×1 IMPLANT
GLOVE BIOGEL PI IND STRL 7.0 (GLOVE) ×2 IMPLANT
LENS IOL TECNIS EYHANCE 17.5 (Intraocular Lens) IMPLANT
NDL HYPO 18GX1.5 BLUNT FILL (NEEDLE) ×1 IMPLANT
NEEDLE HYPO 18GX1.5 BLUNT FILL (NEEDLE) ×1
PAD ARMBOARD 7.5X6 YLW CONV (MISCELLANEOUS) ×1 IMPLANT
POSITIONER HEAD 8X9X4 ADT (SOFTGOODS) ×1 IMPLANT
SYR TB 1ML LL NO SAFETY (SYRINGE) ×1 IMPLANT
TAPE SURG TRANSPORE 1 IN (GAUZE/BANDAGES/DRESSINGS) IMPLANT
WATER STERILE IRR 250ML POUR (IV SOLUTION) ×1 IMPLANT

## 2023-11-02 NOTE — Discharge Instructions (Signed)
Please discharge patient when stable, will follow up today with Dr. Tod Abrahamsen at the [facility] [place] office immediately following discharge.  Leave shield in place until visit.  All paperwork with discharge instructions will be given at the office.  [facility] Address:  [address]  [place], [place] [address]  Dr. Feliciano Wynter's Phone: [phone]  

## 2023-11-02 NOTE — Anesthesia Procedure Notes (Signed)
Procedure Name: MAC Date/Time: 11/02/2023 11:44 AM  Performed by: Julian Reil, CRNAPre-anesthesia Checklist: Patient identified, Emergency Drugs available, Suction available and Patient being monitored Patient Re-evaluated:Patient Re-evaluated prior to induction Oxygen Delivery Method: Nasal cannula Placement Confirmation: positive ETCO2

## 2023-11-02 NOTE — Transfer of Care (Addendum)
Immediate Anesthesia Transfer of Care Note  Patient: Barbara Savage  Procedure(s) Performed: CATARACT EXTRACTION PHACO AND INTRAOCULAR LENS PLACEMENT (IOC) (Left: Eye)  Patient Location: Short Stay  Anesthesia Type:MAC  Level of Consciousness: awake, alert , and oriented  Airway & Oxygen Therapy: Patient Spontanous Breathing  Post-op Assessment: Report given to RN and Post -op Vital signs reviewed and stable  Post vital signs: Reviewed and stable  Last Vitals:  Vitals Value Taken Time  BP 130/52   Temp 98 F   Pulse 64   Resp 16   SpO2 100%     Last Pain:  Vitals:   11/02/23 1040  TempSrc: Oral         Complications: No notable events documented.

## 2023-11-02 NOTE — Op Note (Signed)
Date of procedure: 11/02/23  Pre-operative diagnosis: Visually significant age-related nuclear cataract, Left Eye (H25.12)  Post-operative diagnosis: Visually significant age-related nuclear cataract, Left Eye  Procedure: Removal of cataract via phacoemulsification and insertion of intra-ocular lens J&J DIB00 +17.5D into the capsular bag of the Left Eye  Attending surgeon: Ronal Fear, MD  Anesthesia: MAC, Topical Akten  Complications: None  Estimated Blood Loss: <15mL (minimal)  Specimens: None  Implants:  Implant Name Type Inv. Item Serial No. Manufacturer Lot No. LRB No. Used Action  LENS IOL TECNIS EYHANCE 17.5 - [id number] Intraocular Lens LENS IOL TECNIS EYHANCE 17.5  SIGHTPATH  Left 1 Implanted    Indications:  Visually significant age-related cataract, Left Eye  Procedure:  The patient was seen and identified in the pre-operative area. The operative eye was identified and dilated.  The operative eye was marked.  Topical anesthesia was administered to the operative eye.     The patient was then to the operative suite and placed in the supine position.  A timeout was performed confirming the patient, procedure to be performed, and all other relevant information.   The patient's face was prepped and draped in the usual fashion for intra-ocular surgery.  A lid speculum was placed into the operative eye and the surgical microscope moved into place and focused.  An inferotemporal paracentesis was created using a 20 gauge paracentesis blade.  BSS mixed with Omidria, followed by 1% lidocaine was injected into the anterior chamber.  Viscoelastic was injected into the anterior chamber.  A temporal clear-corneal main wound incision was created using a 2.44mm microkeratome.  A continuous curvilinear capsulorrhexis was initiated using an irrigating cystitome and completed using capsulorrhexis forceps.  Hydrodissection and hydrodeliniation were performed.  Viscoelastic was injected into  the anterior chamber.  A phacoemulsification handpiece and a chopper as a second instrument were used to remove the nucleus and epinucleus. The irrigation/aspiration handpiece was used to remove any remaining cortical material.   The capsular bag was reinflated with viscoelastic, checked, and found to be intact.  The intraocular lens was inserted into the capsular bag.  The irrigation/aspiration handpiece was used to remove any remaining viscoelastic.  The clear corneal wound and paracentesis wounds were then hydrated and checked with Weck-Cels to be watertight. Moxifloxacin was instilled into the anterior chamber.  The lid-speculum and drape was removed, and the patient's face was cleaned with a wet and dry 4x4.  A clear shield was taped over the eye. The patient was taken to the post-operative care unit in good condition, having tolerated the procedure well.  Post-Op Instructions: The patient will follow up at [facility] for a same day post-operative evaluation and will receive all other orders and instructions.

## 2023-11-02 NOTE — Interval H&P Note (Signed)
History and Physical Interval Note:  11/02/2023 10:54 AM  The H and P was reviewed and updated. The patient was examined.  No changes were found after exam.  The surgical eye was marked.  Barbara Savage

## 2023-11-02 NOTE — Anesthesia Preprocedure Evaluation (Signed)
Anesthesia Evaluation  Patient identified by MRN, date of birth, ID band Patient awake and Patient confused    Reviewed: Allergy & Precautions, H&P , NPO status , Patient's Chart, lab work & pertinent test results, reviewed documented beta blocker date and time   History of Anesthesia Complications (+) Family history of anesthesia reaction  Airway Mallampati: II  TM Distance: >3 FB Neck ROM: full    Dental no notable dental hx.    Pulmonary neg pulmonary ROS, shortness of breath, asthma , pneumonia, former smoker   Pulmonary exam normal breath sounds clear to auscultation       Cardiovascular Exercise Tolerance: Good hypertension, negative cardio ROS + dysrhythmias  Rhythm:regular Rate:Normal     Neuro/Psych  PSYCHIATRIC DISORDERS     Dementia  Neuromuscular disease negative neurological ROS  negative psych ROS   GI/Hepatic negative GI ROS, Neg liver ROS,,,  Endo/Other  negative endocrine ROS    Renal/GU negative Renal ROS  negative genitourinary   Musculoskeletal   Abdominal   Peds  Hematology negative hematology ROS (+)   Anesthesia Other Findings   Reproductive/Obstetrics negative OB ROS                             Anesthesia Physical Anesthesia Plan  ASA: 3  Anesthesia Plan: MAC   Post-op Pain Management:    Induction:   PONV Risk Score and Plan:   Airway Management Planned:   Additional Equipment:   Intra-op Plan:   Post-operative Plan:   Informed Consent: I have reviewed the patients History and Physical, chart, labs and discussed the procedure including the risks, benefits and alternatives for the proposed anesthesia with the patient or authorized representative who has indicated his/her understanding and acceptance.     Dental Advisory Given  Plan Discussed with: CRNA  Anesthesia Plan Comments:        Anesthesia Quick Evaluation

## 2023-11-03 NOTE — Anesthesia Postprocedure Evaluation (Signed)
Anesthesia Post Note  Patient: Barbara Savage  Procedure(s) Performed: CATARACT EXTRACTION PHACO AND INTRAOCULAR LENS PLACEMENT (IOC) (Left: Eye)  Patient location during evaluation: Phase II Anesthesia Type: MAC Level of consciousness: awake Pain management: pain level controlled Vital Signs Assessment: post-procedure vital signs reviewed and stable Respiratory status: spontaneous breathing and respiratory function stable Cardiovascular status: blood pressure returned to baseline and stable Postop Assessment: no headache and no apparent nausea or vomiting Anesthetic complications: no Comments: Late entry   No notable events documented.   Last Vitals:  Vitals:   11/02/23 1040 11/02/23 1207  BP: (!) 131/50 (!) 130/52  Pulse: 63 (!) 54  Resp: 18 19  Temp: 36.5 C 36.7 C  SpO2: 93% 100%    Last Pain:  Vitals:   11/02/23 1207  TempSrc: Oral  PainSc: 0-No pain                 Windell Norfolk

## 2023-11-13 ENCOUNTER — Encounter (HOSPITAL_COMMUNITY): Payer: Self-pay

## 2023-11-13 ENCOUNTER — Encounter (HOSPITAL_COMMUNITY)
Admission: RE | Admit: 2023-11-13 | Discharge: 2023-11-13 | Disposition: A | Discharge status: Home / Self Care | Payer: Medicare Other | Source: Ambulatory Visit | Attending: Optometry | Admitting: Optometry

## 2023-11-14 NOTE — H&P (Signed)
Surgical History & Physical  Patient Name: Barbara Savage  DOB: August 02, 1941  Surgery: Cataract extraction with intraocular lens implant phacoemulsification; Right Eye Surgeon: Pecolia Ades MD Surgery Date: 11/16/2023 Pre-Op Date: 11/07/2023  HPI: A 13 Yr. old female patient 1. The patient is returning for a 5 DAY POST-OP OS/PRE -OP OD follow-up of the left eye. Since the last visit, the affected area is stable. The patient's vision is improved. The condition's severity is constant. Patient is following medication instructions. The patient states she is having trouble reading medicine bottles, writing checks and recognizing faces. She is scheduled for cat sx in the OD 11/22. HPI was performed by Pecolia Ades .  Medical History: Cataracts OD: Choroidal Nevus  Lung Problems memory loss  Review of Systems Neurological memory loss Respiratory Asthma All recorded systems are negative except as noted above.  Social Never smoked   Medication Prednisolone-moxiflox-bromfen,  Aricept ,  celecoxib ,  rosuvastatin ,  hydroxychloroquine ,  alendronate ,  memantine ,  ciprofloxacin HCl ,  prednisolone acetate   Sx/Procedures Phaco c IOL OS,  Shoulder Replacement  Drug Allergies  penicillin   History & Physical: Heent: cataract  NECK: supple without bruits LUNGS: lungs clear to auscultation CV: regular rate and rhythm Abdomen: soft and non-tender  Impression & Plan: Assessment: 1.  CATARACT AGE-RELATED NUCLEAR; ,, Right Eye (H25.11) 2.  INTRAOCULAR LENS IOL ; Left Eye (Z96.1) 3.  Pseudophakia- post-op; Left Eye (Z98.42)  Plan: 1.  Cataracts are visually significant and account for the patient's complaints. Discussed all risks, benefits, procedures and recovery, including infection, loss of vision and eye, need for glasses after surgery or additional procedures. Patient understands changing glasses will not improve vision. Patient indicated understanding of procedure. All  questions answered. Patient desires to have surgery, recommend phacoemulsification with intraocular lens. Patient to have preliminary testing necessary (Argos/IOL Master, Mac OCT, TOPO) Educational materials provided:Cataract. RECOMMEND STANDARD VS EYEHANCE VS TORIC VS VIVTY  Plan: - Proceed with cataract surgery OD when reader - DIB00 with best distance vision OU - Early Dementia, very talkative - Ok with lying flat - No prior eye surgeries - Will use PF and cipro gtts  2.  Stable. Doing well since surgery  3.  POD#5 Continue with eye drops as directed. Reviewed all post-op precautions. Call with increased redness, swelling, pain or loss of vision. Avoid swimming pools and hot tubs for 1 week.

## 2023-11-16 ENCOUNTER — Ambulatory Visit (HOSPITAL_COMMUNITY): Payer: Medicare Other | Admitting: Anesthesiology

## 2023-11-16 ENCOUNTER — Encounter (HOSPITAL_COMMUNITY): Payer: Self-pay | Admitting: Optometry

## 2023-11-16 ENCOUNTER — Ambulatory Visit (HOSPITAL_COMMUNITY)
Admission: RE | Admit: 2023-11-16 | Discharge: 2023-11-16 | Disposition: A | Discharge status: Home / Self Care | Payer: Medicare Other | Attending: Optometry | Admitting: Optometry

## 2023-11-16 ENCOUNTER — Encounter (HOSPITAL_COMMUNITY)
Admission: RE | Disposition: A | Discharge status: Home / Self Care | Payer: Self-pay | Source: Home / Self Care | Attending: Optometry

## 2023-11-16 DIAGNOSIS — Z961 Presence of intraocular lens: Secondary | ICD-10-CM | POA: Diagnosis not present

## 2023-11-16 DIAGNOSIS — H2511 Age-related nuclear cataract, right eye: Secondary | ICD-10-CM

## 2023-11-16 DIAGNOSIS — F0390 Unspecified dementia without behavioral disturbance: Secondary | ICD-10-CM | POA: Diagnosis not present

## 2023-11-16 DIAGNOSIS — I10 Essential (primary) hypertension: Secondary | ICD-10-CM | POA: Insufficient documentation

## 2023-11-16 DIAGNOSIS — Z9842 Cataract extraction status, left eye: Secondary | ICD-10-CM | POA: Insufficient documentation

## 2023-11-16 SURGERY — CATARACT EXTRACTION PHACO AND INTRAOCULAR LENS PLACEMENT (IOC) with placement of Corticosteroid
Anesthesia: Monitor Anesthesia Care | Site: Eye | Laterality: Right

## 2023-11-16 MED ORDER — TROPICAMIDE 1 % OP SOLN
1.0000 [drp] | OPHTHALMIC | Status: AC
Start: 2023-11-16 — End: 2023-11-16
  Administered 2023-11-16 (×3): 1 [drp] via OPHTHALMIC

## 2023-11-16 MED ORDER — LIDOCAINE HCL (PF) 1 % IJ SOLN
INTRAMUSCULAR | Status: DC | PRN
  Administered 2023-11-16: 1 mL

## 2023-11-16 MED ORDER — PHENYLEPHRINE-KETOROLAC 1-0.3 % IO SOLN
INTRAOCULAR | Status: DC | PRN
  Administered 2023-11-16: 500 mL via OPHTHALMIC

## 2023-11-16 MED ORDER — CHLORHEXIDINE GLUCONATE 0.12 % MT SOLN: 15.0000 mL | Freq: Once | OROMUCOSAL | Status: DC

## 2023-11-16 MED ORDER — MOXIFLOXACIN HCL 5 MG/ML IO SOLN
INTRAOCULAR | Status: DC | PRN
  Administered 2023-11-16: .3 mL via INTRACAMERAL

## 2023-11-16 MED ORDER — CHLORHEXIDINE GLUCONATE 0.12 % MT SOLN
15.0000 mL | Freq: Once | OROMUCOSAL | Status: DC
Start: 2023-11-16 — End: 2023-11-16

## 2023-11-16 MED ORDER — STERILE WATER FOR IRRIGATION IR SOLN
Status: DC | PRN
  Administered 2023-11-16: 250 mL

## 2023-11-16 MED ORDER — PHENYLEPHRINE HCL 2.5 % OP SOLN
1.0000 [drp] | OPHTHALMIC | Status: AC
Start: 2023-11-16 — End: 2023-11-16
  Administered 2023-11-16 (×3): 1 [drp] via OPHTHALMIC

## 2023-11-16 MED ORDER — LIDOCAINE HCL 3.5 % OP GEL
1.0000 | Freq: Once | OPHTHALMIC | Status: AC
  Administered 2023-11-16: 1 via OPHTHALMIC

## 2023-11-16 MED ORDER — SIGHTPATH DOSE#1 NA HYALUR & NA CHOND-NA HYALUR IO KIT
PACK | INTRAOCULAR | Status: DC | PRN
  Administered 2023-11-16: 1 via OPHTHALMIC

## 2023-11-16 MED ORDER — SODIUM CHLORIDE 0.9% FLUSH: 10.0000 mL | Freq: Two times a day (BID) | INTRAVENOUS | Status: DC

## 2023-11-16 MED ORDER — POVIDONE-IODINE 5 % OP SOLN
OPHTHALMIC | Status: DC | PRN
  Administered 2023-11-16: 1 via OPHTHALMIC

## 2023-11-16 MED ORDER — TETRACAINE HCL 0.5 % OP SOLN
1.0000 [drp] | OPHTHALMIC | Status: AC
Start: 2023-11-16 — End: 2023-11-16
  Administered 2023-11-16 (×3): 1 [drp] via OPHTHALMIC

## 2023-11-16 MED ORDER — ORAL CARE MOUTH RINSE
15.0000 mL | Freq: Once | OROMUCOSAL | Status: DC
Start: 2023-11-16 — End: 2023-11-16

## 2023-11-16 MED ORDER — SODIUM CHLORIDE 0.9% FLUSH
INTRAVENOUS | Status: DC | PRN
  Administered 2023-11-16: 3 mL via INTRAVENOUS

## 2023-11-16 MED ORDER — ORAL CARE MOUTH RINSE: 15.0000 mL | Freq: Once | OROMUCOSAL | Status: DC

## 2023-11-16 MED ORDER — BSS IO SOLN
INTRAOCULAR | Status: DC | PRN
  Administered 2023-11-16: 15 mL via INTRAOCULAR

## 2023-11-16 SURGICAL SUPPLY — 15 items
CATARACT SUITE SIGHTPATH (MISCELLANEOUS) ×1 IMPLANT
CLOTH BEACON ORANGE TIMEOUT ST (SAFETY) ×1 IMPLANT
DRSG TEGADERM 4X4.75 (GAUZE/BANDAGES/DRESSINGS) ×1 IMPLANT
EYE SHIELD UNIVERSAL CLEAR (GAUZE/BANDAGES/DRESSINGS) IMPLANT
FEE CATARACT SUITE SIGHTPATH (MISCELLANEOUS) ×1 IMPLANT
GLOVE BIOGEL PI IND STRL 7.0 (GLOVE) ×2 IMPLANT
LENS IOL TECNIS EYHANCE 17.0 (Intraocular Lens) IMPLANT
NDL HYPO 18GX1.5 BLUNT FILL (NEEDLE) ×1 IMPLANT
NEEDLE HYPO 18GX1.5 BLUNT FILL (NEEDLE) ×1 IMPLANT
PAD ARMBOARD 7.5X6 YLW CONV (MISCELLANEOUS) ×1 IMPLANT
POSITIONER HEAD 8X9X4 ADT (SOFTGOODS) ×1 IMPLANT
RING MALYGIN 7.0 (MISCELLANEOUS) IMPLANT
SYR TB 1ML LL NO SAFETY (SYRINGE) ×1 IMPLANT
TAPE SURG TRANSPORE 1 IN (GAUZE/BANDAGES/DRESSINGS) IMPLANT
WATER STERILE IRR 250ML POUR (IV SOLUTION) ×1 IMPLANT

## 2023-11-16 NOTE — Transfer of Care (Addendum)
Immediate Anesthesia Transfer of Care Note  Patient: Barbara Savage  Procedure(s) Performed: CATARACT EXTRACTION PHACO AND INTRAOCULAR LENS PLACEMENT (IOC) (Right: Eye)  Patient Location: Short Stay  Anesthesia Type:MAC  Level of Consciousness: awake, alert , and patient cooperative  Airway & Oxygen Therapy: Patient Spontanous Breathing  Post-op Assessment: Report given to RN and Post -op Vital signs reviewed and stable  Post vital signs: Reviewed and stable  Last Vitals:  Vitals Value Taken Time  BP 138/47 11/16/23   0823  Temp 36.6 11/16/23   0823  Pulse 56 11/16/23   0823  Resp 12 11/16/23   0823  SpO2 100% 11/16/23   0823    Last Pain: There were no vitals filed for this visit.    Patients Stated Pain Goal: 1 (11/16/23 0703)  Complications: No notable events documented.

## 2023-11-16 NOTE — Op Note (Signed)
Date of procedure: 11/16/23  Pre-operative diagnosis: Visually significant age-related nuclear cataract, Right Eye (H25.11)  Post-operative diagnosis: Visually significant age-related nuclear cataract, Right Eye  Procedure: Removal of cataract via phacoemulsification and insertion of intra-ocular lens J&J DIBOO +17.0D into the capsular bag of the Right Eye  Attending surgeon: Pecolia Ades, MD  Anesthesia: MAC, Topical Akten  Complications: None  Estimated Blood Loss: <47mL (minimal)  Specimens: None  Implants:  Implant Name Type Inv. Item Serial No. Manufacturer Lot No. LRB No. Used Action  LENS IOL TECNIS EYHANCE 17.0 - [id number] Intraocular Lens LENS IOL TECNIS EYHANCE 17.0 [id number] [employer]  Right 1 Implanted    Indications:  Visually significant age-related cataract, Right Eye  Procedure:  The patient was seen and identified in the pre-operative area. The operative eye was identified and dilated.  The operative eye was marked.  Topical anesthesia was administered to the operative eye.     The patient was then to the operative suite and placed in the supine position.  A timeout was performed confirming the patient, procedure to be performed, and all other relevant information.   The patient's face was prepped and draped in the usual fashion for intra-ocular surgery.  A lid speculum was placed into the operative eye and the surgical microscope moved into place and focused.  A superotemporal paracentesis was created using a 20 gauge paracentesis blade.  BSS mixed with Omidria, followed by 1% lidocaine was injected into the anterior chamber.  Viscoelastic was injected into the anterior chamber.  A temporal clear-corneal main wound incision was created using a 2.78mm microkeratome.  A continuous curvilinear capsulorrhexis was initiated using an irrigating cystitome and completed using capsulorrhexis forceps.  Hydrodissection and hydrodeliniation were performed.  Viscoelastic was  injected into the anterior chamber.  A phacoemulsification handpiece and a chopper as a second instrument were used to remove the nucleus and epinucleus. The irrigation/aspiration handpiece was used to remove any remaining cortical material.   The capsular bag was reinflated with viscoelastic, checked, and found to be intact.  The intraocular lens was inserted into the capsular bag.  The irrigation/aspiration handpiece was used to remove any remaining viscoelastic.  The clear corneal wound and paracentesis wounds were then hydrated and checked with Weck-Cels to be watertight. Moxifloxacin was instilled into the anterior chamber.  The lid-speculum and drape was removed, and the patient's face was cleaned with a wet and dry 4x4. A clear shield was taped over the eye. The patient was taken to the post-operative care unit in good condition, having tolerated the procedure well.  Post-Op Instructions: The patient will follow up at [facility] for a same day post-operative evaluation and will receive all other orders and instructions.

## 2023-11-16 NOTE — Discharge Instructions (Signed)
Please discharge patient when stable, will follow up today with Dr. Tod Abrahamsen at the [facility] [place] office immediately following discharge.  Leave shield in place until visit.  All paperwork with discharge instructions will be given at the office.  [facility] Address:  [address]  [place], [place] [address]  Dr. Feliciano Wynter's Phone: [phone]  

## 2023-11-16 NOTE — Anesthesia Postprocedure Evaluation (Signed)
Anesthesia Post Note  Patient: CHAYLA TILGHMAN  Procedure(s) Performed: CATARACT EXTRACTION PHACO AND INTRAOCULAR LENS PLACEMENT (IOC) (Right: Eye)  Patient location during evaluation: Phase II Anesthesia Type: MAC Level of consciousness: awake Pain management: pain level controlled Vital Signs Assessment: post-procedure vital signs reviewed and stable Respiratory status: spontaneous breathing and respiratory function stable Cardiovascular status: blood pressure returned to baseline and stable Postop Assessment: no headache and no apparent nausea or vomiting Anesthetic complications: no Comments: Late entry   No notable events documented.   Last Vitals:  Vitals:   11/16/23 0705 11/16/23 0823  BP: (!) 131/51 (!) 138/47  Pulse: 70 (!) 56  Resp: 18 12  Temp: 36.5 C 36.6 C  SpO2: 99% 100%    Last Pain:  Vitals:   11/16/23 0823  TempSrc: Oral  PainSc: 0-No pain                 Windell Norfolk

## 2023-11-16 NOTE — Anesthesia Preprocedure Evaluation (Signed)
Anesthesia Evaluation  Patient identified by MRN, date of birth, ID band Patient awake and Patient confused    Reviewed: Allergy & Precautions, H&P , NPO status , Patient's Chart, lab work & pertinent test results, reviewed documented beta blocker date and time   History of Anesthesia Complications (+) Family history of anesthesia reaction  Airway Mallampati: II  TM Distance: >3 FB Neck ROM: full    Dental no notable dental hx.    Pulmonary neg pulmonary ROS, shortness of breath, asthma , pneumonia, former smoker   Pulmonary exam normal breath sounds clear to auscultation       Cardiovascular Exercise Tolerance: Good hypertension, negative cardio ROS + dysrhythmias  Rhythm:regular Rate:Normal     Neuro/Psych  PSYCHIATRIC DISORDERS     Dementia  Neuromuscular disease negative neurological ROS  negative psych ROS   GI/Hepatic negative GI ROS, Neg liver ROS,,,  Endo/Other  negative endocrine ROS    Renal/GU negative Renal ROS  negative genitourinary   Musculoskeletal   Abdominal   Peds  Hematology negative hematology ROS (+)   Anesthesia Other Findings   Reproductive/Obstetrics negative OB ROS                             Anesthesia Physical Anesthesia Plan  ASA: 3  Anesthesia Plan: MAC   Post-op Pain Management:    Induction:   PONV Risk Score and Plan:   Airway Management Planned:   Additional Equipment:   Intra-op Plan:   Post-operative Plan:   Informed Consent: I have reviewed the patients History and Physical, chart, labs and discussed the procedure including the risks, benefits and alternatives for the proposed anesthesia with the patient or authorized representative who has indicated his/her understanding and acceptance.     Dental Advisory Given  Plan Discussed with: CRNA  Anesthesia Plan Comments:        Anesthesia Quick Evaluation

## 2023-11-16 NOTE — Interval H&P Note (Signed)
History and Physical Interval Note:  11/16/2023 7:23 AM  The H and P was reviewed and updated. The patient was examined.  No changes were found after exam.  The surgical eye was marked.  Sonam Huelsmann

## 2024-04-15 LAB — LAB REPORT - SCANNED: EGFR: 61

## 2024-08-27 ENCOUNTER — Ambulatory Visit: Admitting: Physician Assistant

## 2024-12-29 ENCOUNTER — Encounter: Payer: Self-pay | Admitting: Physician Assistant

## 2024-12-29 ENCOUNTER — Ambulatory Visit: Admitting: Physician Assistant

## 2024-12-29 VITALS — BP 122/52 | HR 65 | Temp 97.5°F | Wt 151.2 lb

## 2024-12-29 DIAGNOSIS — Z7689 Persons encountering health services in other specified circumstances: Secondary | ICD-10-CM

## 2024-12-29 DIAGNOSIS — M0579 Rheumatoid arthritis with rheumatoid factor of multiple sites without organ or systems involvement: Secondary | ICD-10-CM | POA: Diagnosis not present

## 2024-12-29 DIAGNOSIS — F0390 Unspecified dementia without behavioral disturbance: Secondary | ICD-10-CM | POA: Insufficient documentation

## 2024-12-29 DIAGNOSIS — Z23 Encounter for immunization: Secondary | ICD-10-CM

## 2024-12-29 NOTE — Assessment & Plan Note (Signed)
 No concerns currently. Follows regularly with rheumatology. Continue current regimen.

## 2024-12-29 NOTE — Progress Notes (Signed)
 "  New Patient Office Visit  Subjective    Patient ID: Barbara Savage, female    DOB: 1941-01-02  Age: 84 y.o. MRN: [id number]  CC:  Chief Complaint  Patient presents with   Establish Care    Husband states she has some meds prescribes by dr.tate and would like to be refilled regularly    HPI Barbara Savage presents to establish care  Discussed the use of AI scribe software for clinical note transcription with the patient, who gave verbal consent to proceed.  History of Present Illness Barbara Savage is an 84 year old female who presents to establish care. She is accompanied by her husband. Past medical history significant for rheumatoid arthritis and osteoporosis, followed and treated by rheumatology, dementia, followed and treated by neurology, asthma, hyperlipidemia, and hypertension . She had a left shoulder replacement in 2019. Her last blood work was in October with rheumatology. Her last bone density scan was January 2024. She was previously on osteoporosis medication but is not taking it now. She does not have additional concerns today aside from establishing care, and does not need refills at this time.    Outpatient Encounter Medications as of 12/29/2024  Medication Sig   folic acid (FOLVITE) 1 MG tablet Take 1 mg by mouth.   rivastigmine (EXELON) 3 MG capsule TAKE ONE TABLET BY MOUTH EVERY DAY FOR 7 DAYS THEN TAKE ONE TABLET TWICE DAILY AND continue FOR memory loss   alendronate (FOSAMAX) 70 MG/75ML solution Take 70 mg by mouth every 7 (seven) days. Take with a full glass of water  on an empty stomach.   B Complex Vitamins (B COMPLEX 50 PO) Take 1 tablet by mouth daily. Reported on 03/14/2016   celecoxib  (CELEBREX ) 200 MG capsule Take 200 mg by mouth daily.   Cholecalciferol  (VITAMIN D3) 2000 units TABS Take 1 tablet by mouth daily.   donepezil (ARICEPT) 10 MG tablet Take 10 mg by mouth at bedtime.   hydrOXYzine (VISTARIL) 100 MG capsule Take 300 mg by mouth daily.    Lutein 20 MG CAPS Take 1 capsule by mouth daily. Reported on 03/14/2016   memantine (NAMENDA) 5 MG tablet Take 5 mg by mouth 2 (two) times daily.   Misc Natural Products (GLUCOSAMINE CHONDROITIN MSM PO) Take 1 tablet by mouth daily.   Misc Natural Products (TART CHERRY ADVANCED) CAPS Take 1 capsule by mouth daily.   Multiple Vitamin (MULTIVITAMIN WITH MINERALS) TABS tablet Take 1 tablet by mouth daily.   PROAIR  HFA 108 (90 Base) MCG/ACT inhaler Inhale 2 puffs into the lungs every 6 (six) hours as needed for wheezing or shortness of breath.    Quercetin 250 MG TABS Take 1 tablet by mouth daily. For allergy health   traMADol  (ULTRAM ) 50 MG tablet 1 pill q6 prn pain, use if oxycodone  too strong instead of oxycodone  dose   TURMERIC PO Take 1 capsule by mouth daily. With black pepper   No facility-administered encounter medications on file as of 12/29/2024.    Past Medical History:  Diagnosis Date   Arthritis    Asthma    Cancer (HCC)    skin cancer   Celiac disease    Dementia (HCC)    Diverticulosis    Dysrhythmia    occ pvc   Family history of adverse reaction to anesthesia    mother - slow to awaken   Pneumonia    years ago   Sciatica     Past Surgical History:  Procedure  Laterality Date   TONSILLECTOMY     TOTAL SHOULDER ARTHROPLASTY Left 04/10/2018   TOTAL SHOULDER ARTHROPLASTY Left 04/10/2018   Procedure: LEFT TOTAL SHOULDER ARTHROPLASTY;  Surgeon: Cristy Bonner DASEN, MD;  Location: [facility] OR;  Service: Orthopedics;  Laterality: Left;    Family History  Problem Relation Age of Onset   Colon cancer Paternal Grandmother 46   Kidney cancer Maternal Grandmother 24    Social History   Socioeconomic History   Marital status: Married    Spouse name: Not on file   Number of children: Not on file   Years of education: Not on file   Highest education level: Not on file  Occupational History   Occupation: IT TRAINER   Tobacco Use   Smoking status: Former    Current packs/day: 0.00     Average packs/day: 2.5 packs/day for 10.0 years (25.0 ttl pk-yrs)    Types: Cigarettes    Start date: 12/25/1968    Quit date: 12/25/1978    Years since quitting: 46.0   Smokeless tobacco: Never  Vaping Use   Vaping status: Never Used  Substance and Sexual Activity   Alcohol use: No    Alcohol/week: 0.0 standard drinks of alcohol   Drug use: No   Sexual activity: Not on file  Other Topics Concern   Not on file  Social History Narrative   Not on file   Social Drivers of Health   Tobacco Use: Low Risk  (11/17/2024)   Received from [facility]   Patient History    Smoking Tobacco Use: Never    Smokeless Tobacco Use: Never    Passive Exposure: Not on file  Financial Resource Strain: Not on file  Food Insecurity: Not on file  Transportation Needs: Not on file  Physical Activity: Not on file  Stress: Not on file  Social Connections: Not on file  Intimate Partner Violence: Not on file  Depression (PHQ2-9): Low Risk (12/29/2024)   Depression (PHQ2-9)    PHQ-2 Score: 0  Alcohol Screen: Not on file  Housing: Unknown (05/21/2024)   Received from [facility]   Epic    Unable to Pay for Housing in the Last Year: Not on file    Number of Times Moved in the Last Year: Not on file    At any time in the past 12 months, were you homeless or living in a shelter (including now)?: No  Utilities: Not on file  Health Literacy: Not on file    Review of Systems  Unable to perform ROS: Dementia  Denies anything bothersome at present     Objective    BP (!) 122/52   Pulse 65   Temp (!) 97.5 F (36.4 C)   Wt 151 lb 3.2 oz (68.6 kg)   SpO2 97%   BMI 24.40 kg/m   Physical Exam Constitutional:      General: She is not in acute distress.    Appearance: Normal appearance. She is normal weight. She is not ill-appearing.  HENT:     Head: Normocephalic and atraumatic.     Mouth/Throat:     Mouth: Mucous membranes are moist.     Pharynx: Oropharynx is  clear.  Eyes:     Extraocular Movements: Extraocular movements intact.     Conjunctiva/sclera: Conjunctivae normal.  Cardiovascular:     Rate and Rhythm: Normal rate and regular rhythm.     Heart sounds: Normal heart sounds. No murmur heard. Pulmonary:  Effort: Pulmonary effort is normal.     Breath sounds: Normal breath sounds. No wheezing, rhonchi or rales.  Musculoskeletal:     Right lower leg: No edema.     Left lower leg: No edema.  Skin:    General: Skin is warm and dry.  Neurological:     General: No focal deficit present.     Mental Status: She is alert.  Psychiatric:        Mood and Affect: Mood normal.        Behavior: Behavior normal.     Lab from October 2025 reviewed today. Reviewed neurology and rheumatology notes.     Assessment & Plan:  Encounter to establish care  Rheumatoid arthritis involving multiple sites with positive rheumatoid factor (HCC) Assessment & Plan: No concerns currently. Follows regularly with rheumatology. Continue current regimen.    Dementia without behavioral disturbance, psychotic disturbance, mood disturbance, or anxiety, unspecified dementia severity, unspecified dementia type (HCC) Assessment & Plan: Stable at this time. Follows regularly with neurology. Continue current regimen.    Immunization due  Other orders -     Flu vaccine HIGH DOSE PF(Fluzone Trivalent)    Return in about 3 months (around 03/29/2025) for phys .   Taylor Levick, PA-C  "

## 2024-12-29 NOTE — Assessment & Plan Note (Signed)
 Stable at this time. Follows regularly with neurology. Continue current regimen.

## 2025-03-30 ENCOUNTER — Encounter: Admitting: Physician Assistant
